# Patient Record
Sex: Male | Born: 1975
Health system: Southern US, Community
[De-identification: ages and names within clinical notes are randomized; demographics above are authoritative.]

---

## 2019-04-19 ENCOUNTER — Encounter (HOSPITAL_COMMUNITY): Payer: Self-pay | Admitting: Emergency Medicine

## 2019-04-19 ENCOUNTER — Inpatient Hospital Stay (HOSPITAL_COMMUNITY)
Admission: EM | Admit: 2019-04-19 | Discharge: 2019-04-23 | DRG: 957 | Disposition: A | Discharge status: Home / Self Care | Payer: PRIVATE HEALTH INSURANCE | Attending: General Surgery | Admitting: General Surgery

## 2019-04-19 ENCOUNTER — Other Ambulatory Visit: Payer: Self-pay

## 2019-04-19 ENCOUNTER — Emergency Department (HOSPITAL_COMMUNITY): Payer: PRIVATE HEALTH INSURANCE

## 2019-04-19 DIAGNOSIS — S27321A Contusion of lung, unilateral, initial encounter: Secondary | ICD-10-CM | POA: Diagnosis present

## 2019-04-19 DIAGNOSIS — S14107A Unspecified injury at C7 level of cervical spinal cord, initial encounter: Secondary | ICD-10-CM | POA: Diagnosis present

## 2019-04-19 DIAGNOSIS — E876 Hypokalemia: Secondary | ICD-10-CM | POA: Diagnosis not present

## 2019-04-19 DIAGNOSIS — S12600A Unspecified displaced fracture of seventh cervical vertebra, initial encounter for closed fracture: Secondary | ICD-10-CM | POA: Diagnosis present

## 2019-04-19 DIAGNOSIS — S0990XA Unspecified injury of head, initial encounter: Secondary | ICD-10-CM

## 2019-04-19 DIAGNOSIS — Z20828 Contact with and (suspected) exposure to other viral communicable diseases: Secondary | ICD-10-CM | POA: Diagnosis present

## 2019-04-19 DIAGNOSIS — Z23 Encounter for immunization: Secondary | ICD-10-CM

## 2019-04-19 DIAGNOSIS — S63284A Dislocation of proximal interphalangeal joint of right ring finger, initial encounter: Secondary | ICD-10-CM | POA: Diagnosis present

## 2019-04-19 DIAGNOSIS — J9811 Atelectasis: Secondary | ICD-10-CM

## 2019-04-19 DIAGNOSIS — S0240CA Maxillary fracture, right side, initial encounter for closed fracture: Secondary | ICD-10-CM | POA: Diagnosis present

## 2019-04-19 DIAGNOSIS — Z419 Encounter for procedure for purposes other than remedying health state, unspecified: Secondary | ICD-10-CM

## 2019-04-19 DIAGNOSIS — S63289A Dislocation of proximal interphalangeal joint of unspecified finger, initial encounter: Secondary | ICD-10-CM

## 2019-04-19 DIAGNOSIS — S022XXA Fracture of nasal bones, initial encounter for closed fracture: Secondary | ICD-10-CM | POA: Diagnosis present

## 2019-04-19 DIAGNOSIS — S12500A Unspecified displaced fracture of sixth cervical vertebra, initial encounter for closed fracture: Secondary | ICD-10-CM

## 2019-04-19 DIAGNOSIS — S02401A Maxillary fracture, unspecified, initial encounter for closed fracture: Secondary | ICD-10-CM | POA: Diagnosis present

## 2019-04-19 DIAGNOSIS — Z978 Presence of other specified devices: Secondary | ICD-10-CM

## 2019-04-19 DIAGNOSIS — S0181XA Laceration without foreign body of other part of head, initial encounter: Secondary | ICD-10-CM | POA: Diagnosis present

## 2019-04-19 DIAGNOSIS — S0101XA Laceration without foreign body of scalp, initial encounter: Secondary | ICD-10-CM | POA: Diagnosis present

## 2019-04-19 DIAGNOSIS — S22069A Unspecified fracture of T7-T8 vertebra, initial encounter for closed fracture: Secondary | ICD-10-CM | POA: Diagnosis present

## 2019-04-19 DIAGNOSIS — S22059A Unspecified fracture of T5-T6 vertebra, initial encounter for closed fracture: Secondary | ICD-10-CM | POA: Diagnosis present

## 2019-04-19 DIAGNOSIS — S0285XA Fracture of orbit, unspecified, initial encounter for closed fracture: Secondary | ICD-10-CM | POA: Diagnosis present

## 2019-04-19 DIAGNOSIS — S22000A Wedge compression fracture of unspecified thoracic vertebra, initial encounter for closed fracture: Secondary | ICD-10-CM

## 2019-04-19 DIAGNOSIS — M79641 Pain in right hand: Secondary | ICD-10-CM

## 2019-04-19 DIAGNOSIS — S22039A Unspecified fracture of third thoracic vertebra, initial encounter for closed fracture: Secondary | ICD-10-CM | POA: Diagnosis present

## 2019-04-19 DIAGNOSIS — S060X0A Concussion without loss of consciousness, initial encounter: Principal | ICD-10-CM | POA: Diagnosis present

## 2019-04-19 DIAGNOSIS — F17210 Nicotine dependence, cigarettes, uncomplicated: Secondary | ICD-10-CM | POA: Diagnosis present

## 2019-04-19 DIAGNOSIS — M79642 Pain in left hand: Secondary | ICD-10-CM

## 2019-04-19 DIAGNOSIS — S12601A Unspecified nondisplaced fracture of seventh cervical vertebra, initial encounter for closed fracture: Secondary | ICD-10-CM

## 2019-04-19 DIAGNOSIS — S42012A Anterior displaced fracture of sternal end of left clavicle, initial encounter for closed fracture: Secondary | ICD-10-CM | POA: Diagnosis present

## 2019-04-19 DIAGNOSIS — T148XXA Other injury of unspecified body region, initial encounter: Secondary | ICD-10-CM

## 2019-04-19 DIAGNOSIS — S42018A Nondisplaced fracture of sternal end of left clavicle, initial encounter for closed fracture: Secondary | ICD-10-CM

## 2019-04-19 DIAGNOSIS — S2242XA Multiple fractures of ribs, left side, initial encounter for closed fracture: Secondary | ICD-10-CM | POA: Diagnosis present

## 2019-04-19 DIAGNOSIS — Y92410 Unspecified street and highway as the place of occurrence of the external cause: Secondary | ICD-10-CM

## 2019-04-19 DIAGNOSIS — D62 Acute posthemorrhagic anemia: Secondary | ICD-10-CM | POA: Diagnosis not present

## 2019-04-19 DIAGNOSIS — J9601 Acute respiratory failure with hypoxia: Secondary | ICD-10-CM | POA: Diagnosis not present

## 2019-04-19 DIAGNOSIS — S0292XA Unspecified fracture of facial bones, initial encounter for closed fracture: Secondary | ICD-10-CM

## 2019-04-19 DIAGNOSIS — S0219XA Other fracture of base of skull, initial encounter for closed fracture: Secondary | ICD-10-CM | POA: Diagnosis present

## 2019-04-19 MED ORDER — TETANUS-DIPHTH-ACELL PERTUSSIS 5-2.5-18.5 LF-MCG/0.5 IM SUSP
0.5000 mL | Freq: Once | INTRAMUSCULAR | Status: AC
  Administered 2019-04-20: 02:00:00 0.5 mL via INTRAMUSCULAR
  Filled 2019-04-19: qty 0.5
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
## 2019-04-20 ENCOUNTER — Inpatient Hospital Stay (HOSPITAL_COMMUNITY): Payer: PRIVATE HEALTH INSURANCE | Admitting: Certified Registered"

## 2019-04-20 ENCOUNTER — Encounter (HOSPITAL_COMMUNITY): Payer: Self-pay | Admitting: Certified Registered Nurse Anesthetist

## 2019-04-20 ENCOUNTER — Inpatient Hospital Stay (HOSPITAL_COMMUNITY): Payer: PRIVATE HEALTH INSURANCE

## 2019-04-20 ENCOUNTER — Encounter (HOSPITAL_COMMUNITY): Admission: EM | Disposition: A | Discharge status: Home / Self Care | Payer: Self-pay | Source: Home / Self Care

## 2019-04-20 ENCOUNTER — Emergency Department (HOSPITAL_COMMUNITY): Payer: PRIVATE HEALTH INSURANCE

## 2019-04-20 DIAGNOSIS — S22039A Unspecified fracture of third thoracic vertebra, initial encounter for closed fracture: Secondary | ICD-10-CM | POA: Diagnosis present

## 2019-04-20 DIAGNOSIS — S2242XA Multiple fractures of ribs, left side, initial encounter for closed fracture: Secondary | ICD-10-CM | POA: Diagnosis present

## 2019-04-20 DIAGNOSIS — S0285XA Fracture of orbit, unspecified, initial encounter for closed fracture: Secondary | ICD-10-CM | POA: Diagnosis present

## 2019-04-20 DIAGNOSIS — S0240CA Maxillary fracture, right side, initial encounter for closed fracture: Secondary | ICD-10-CM | POA: Diagnosis present

## 2019-04-20 DIAGNOSIS — S22069A Unspecified fracture of T7-T8 vertebra, initial encounter for closed fracture: Secondary | ICD-10-CM | POA: Diagnosis present

## 2019-04-20 DIAGNOSIS — S12500A Unspecified displaced fracture of sixth cervical vertebra, initial encounter for closed fracture: Secondary | ICD-10-CM | POA: Diagnosis present

## 2019-04-20 DIAGNOSIS — Z20828 Contact with and (suspected) exposure to other viral communicable diseases: Secondary | ICD-10-CM | POA: Diagnosis present

## 2019-04-20 DIAGNOSIS — S42012A Anterior displaced fracture of sternal end of left clavicle, initial encounter for closed fracture: Secondary | ICD-10-CM | POA: Diagnosis present

## 2019-04-20 DIAGNOSIS — S022XXA Fracture of nasal bones, initial encounter for closed fracture: Secondary | ICD-10-CM | POA: Diagnosis present

## 2019-04-20 DIAGNOSIS — Y92410 Unspecified street and highway as the place of occurrence of the external cause: Secondary | ICD-10-CM | POA: Diagnosis not present

## 2019-04-20 DIAGNOSIS — S12601A Unspecified nondisplaced fracture of seventh cervical vertebra, initial encounter for closed fracture: Secondary | ICD-10-CM | POA: Diagnosis present

## 2019-04-20 DIAGNOSIS — S14107A Unspecified injury at C7 level of cervical spinal cord, initial encounter: Secondary | ICD-10-CM | POA: Diagnosis present

## 2019-04-20 DIAGNOSIS — S63284A Dislocation of proximal interphalangeal joint of right ring finger, initial encounter: Secondary | ICD-10-CM | POA: Diagnosis present

## 2019-04-20 DIAGNOSIS — S0181XA Laceration without foreign body of other part of head, initial encounter: Secondary | ICD-10-CM | POA: Diagnosis present

## 2019-04-20 DIAGNOSIS — S12600A Unspecified displaced fracture of seventh cervical vertebra, initial encounter for closed fracture: Secondary | ICD-10-CM | POA: Diagnosis present

## 2019-04-20 DIAGNOSIS — S02401A Maxillary fracture, unspecified, initial encounter for closed fracture: Secondary | ICD-10-CM | POA: Diagnosis present

## 2019-04-20 DIAGNOSIS — D62 Acute posthemorrhagic anemia: Secondary | ICD-10-CM | POA: Diagnosis not present

## 2019-04-20 DIAGNOSIS — S22059A Unspecified fracture of T5-T6 vertebra, initial encounter for closed fracture: Secondary | ICD-10-CM | POA: Diagnosis present

## 2019-04-20 DIAGNOSIS — S0101XA Laceration without foreign body of scalp, initial encounter: Secondary | ICD-10-CM | POA: Diagnosis present

## 2019-04-20 DIAGNOSIS — E876 Hypokalemia: Secondary | ICD-10-CM | POA: Diagnosis present

## 2019-04-20 DIAGNOSIS — J9601 Acute respiratory failure with hypoxia: Secondary | ICD-10-CM | POA: Diagnosis not present

## 2019-04-20 DIAGNOSIS — F17210 Nicotine dependence, cigarettes, uncomplicated: Secondary | ICD-10-CM | POA: Diagnosis present

## 2019-04-20 DIAGNOSIS — Z23 Encounter for immunization: Secondary | ICD-10-CM | POA: Diagnosis not present

## 2019-04-20 DIAGNOSIS — S27321A Contusion of lung, unilateral, initial encounter: Secondary | ICD-10-CM | POA: Diagnosis present

## 2019-04-20 DIAGNOSIS — S060X0A Concussion without loss of consciousness, initial encounter: Secondary | ICD-10-CM | POA: Diagnosis present

## 2019-04-20 HISTORY — PX: ANTERIOR CERVICAL DECOMP/DISCECTOMY FUSION: SHX1161

## 2019-04-20 LAB — CBC
HCT: 41.9 % (ref 39.0–52.0)
HCT: 54.3 % — ABNORMAL HIGH (ref 39.0–52.0)
Hemoglobin: 14 g/dL (ref 13.0–17.0)
Hemoglobin: 17.8 g/dL — ABNORMAL HIGH (ref 13.0–17.0)
MCH: 30.3 pg (ref 26.0–34.0)
MCH: 30.3 pg (ref 26.0–34.0)
MCHC: 32.8 g/dL (ref 30.0–36.0)
MCHC: 33.4 g/dL (ref 30.0–36.0)
MCV: 90.7 fL (ref 80.0–100.0)
MCV: 92.3 fL (ref 80.0–100.0)
Platelets: 215 10*3/uL (ref 150–400)
Platelets: 322 10*3/uL (ref 150–400)
RBC: 4.62 MIL/uL (ref 4.22–5.81)
RBC: 5.88 MIL/uL — ABNORMAL HIGH (ref 4.22–5.81)
RDW: 14.6 % (ref 11.5–15.5)
RDW: 15 % (ref 11.5–15.5)
WBC: 14.9 10*3/uL — ABNORMAL HIGH (ref 4.0–10.5)
WBC: 22.2 10*3/uL — ABNORMAL HIGH (ref 4.0–10.5)
nRBC: 0 % (ref 0.0–0.2)
nRBC: 0 % (ref 0.0–0.2)

## 2019-04-20 LAB — POCT I-STAT 7, (LYTES, BLD GAS, ICA,H+H)
Acid-base deficit: 4 mmol/L — ABNORMAL HIGH (ref 0.0–2.0)
Bicarbonate: 22.7 mmol/L (ref 20.0–28.0)
Calcium, Ion: 1.1 mmol/L — ABNORMAL LOW (ref 1.15–1.40)
HCT: 41 % (ref 39.0–52.0)
Hemoglobin: 13.9 g/dL (ref 13.0–17.0)
O2 Saturation: 100 %
Patient temperature: 98.6
Potassium: 3.6 mmol/L (ref 3.5–5.1)
Sodium: 140 mmol/L (ref 135–145)
TCO2: 24 mmol/L (ref 22–32)
pCO2 arterial: 45.2 mmHg (ref 32.0–48.0)
pH, Arterial: 7.308 — ABNORMAL LOW (ref 7.350–7.450)
pO2, Arterial: 358 mmHg — ABNORMAL HIGH (ref 83.0–108.0)

## 2019-04-20 LAB — COMPREHENSIVE METABOLIC PANEL
ALT: 39 U/L (ref 0–44)
AST: 85 U/L — ABNORMAL HIGH (ref 15–41)
Albumin: 3.9 g/dL (ref 3.5–5.0)
Alkaline Phosphatase: 65 U/L (ref 38–126)
Anion gap: 12 (ref 5–15)
BUN: 8 mg/dL (ref 6–20)
CO2: 23 mmol/L (ref 22–32)
Calcium: 8.6 mg/dL — ABNORMAL LOW (ref 8.9–10.3)
Chloride: 103 mmol/L (ref 98–111)
Creatinine, Ser: 1.07 mg/dL (ref 0.61–1.24)
GFR calc Af Amer: >60 mL/min (ref >60)
GFR calc non Af Amer: >60 mL/min (ref >60)
Glucose, Bld: 129 mg/dL — ABNORMAL HIGH (ref 70–99)
Potassium: 3.2 mmol/L — ABNORMAL LOW (ref 3.5–5.1)
Sodium: 138 mmol/L (ref 135–145)
Total Bilirubin: 0.6 mg/dL (ref 0.3–1.2)
Total Protein: 6.1 g/dL — ABNORMAL LOW (ref 6.5–8.1)

## 2019-04-20 LAB — SARS CORONAVIRUS 2 BY RT PCR (HOSPITAL ORDER, PERFORMED IN ~~LOC~~ HOSPITAL LAB): SARS Coronavirus 2: NEGATIVE

## 2019-04-20 LAB — I-STAT CHEM 8, ED
BUN: 9 mg/dL (ref 6–20)
Calcium, Ion: 0.95 mmol/L — ABNORMAL LOW (ref 1.15–1.40)
Chloride: 104 mmol/L (ref 98–111)
Creatinine, Ser: 0.9 mg/dL (ref 0.61–1.24)
Glucose, Bld: 120 mg/dL — ABNORMAL HIGH (ref 70–99)
HCT: 54 % — ABNORMAL HIGH (ref 39.0–52.0)
Hemoglobin: 18.4 g/dL — ABNORMAL HIGH (ref 13.0–17.0)
Potassium: 3.2 mmol/L — ABNORMAL LOW (ref 3.5–5.1)
Sodium: 138 mmol/L (ref 135–145)
TCO2: 22 mmol/L (ref 22–32)

## 2019-04-20 LAB — RAPID URINE DRUG SCREEN, HOSP PERFORMED
Amphetamines: NOT DETECTED
Barbiturates: NOT DETECTED
Benzodiazepines: NOT DETECTED
Cocaine: NOT DETECTED
Opiates: NOT DETECTED
Tetrahydrocannabinol: POSITIVE — AB

## 2019-04-20 LAB — PROTIME-INR
INR: 1.2 (ref 0.8–1.2)
Prothrombin Time: 15.2 seconds (ref 11.4–15.2)

## 2019-04-20 LAB — URINALYSIS, ROUTINE W REFLEX MICROSCOPIC
Bilirubin Urine: NEGATIVE
Glucose, UA: NEGATIVE mg/dL
Ketones, ur: NEGATIVE mg/dL
Leukocytes,Ua: NEGATIVE
Nitrite: NEGATIVE
Protein, ur: 30 mg/dL — AB
Specific Gravity, Urine: 1.01 (ref 1.005–1.030)
pH: 5 (ref 5.0–8.0)

## 2019-04-20 LAB — HIV ANTIBODY (ROUTINE TESTING W REFLEX): HIV Screen 4th Generation wRfx: NONREACTIVE

## 2019-04-20 LAB — SAMPLE TO BLOOD BANK

## 2019-04-20 LAB — LACTIC ACID, PLASMA
Lactic Acid, Venous: 3.1 mmol/L (ref 0.5–1.9)
Lactic Acid, Venous: 3.1 mmol/L (ref 0.5–1.9)

## 2019-04-20 LAB — CDS SEROLOGY

## 2019-04-20 LAB — ETHANOL: Alcohol, Ethyl (B): <10 mg/dL (ref <10)

## 2019-04-20 SURGERY — ANTERIOR CERVICAL DECOMPRESSION/DISCECTOMY FUSION 1 LEVEL
Anesthesia: General | Site: Spine Cervical

## 2019-04-20 MED ORDER — FENTANYL CITRATE (PF) 100 MCG/2ML IJ SOLN
INTRAMUSCULAR | Status: AC
  Filled 2019-04-20: qty 2

## 2019-04-20 MED ORDER — ACETAMINOPHEN 160 MG/5ML PO SOLN
325.0000 mg | Freq: Four times a day (QID) | ORAL | Status: DC
  Administered 2019-04-20: 08:00:00 325 mg
  Filled 2019-04-20: qty 20.3

## 2019-04-20 MED ORDER — DEXAMETHASONE SODIUM PHOSPHATE 10 MG/ML IJ SOLN
INTRAMUSCULAR | Status: AC
  Filled 2019-04-20: qty 1

## 2019-04-20 MED ORDER — 0.9 % SODIUM CHLORIDE (POUR BTL) OPTIME
TOPICAL | Status: DC | PRN
  Administered 2019-04-20: 16:00:00 1000 mL

## 2019-04-20 MED ORDER — ZIPRASIDONE MESYLATE 20 MG IM SOLR
10.0000 mg | Freq: Once | INTRAMUSCULAR | Status: DC
  Filled 2019-04-20: qty 20

## 2019-04-20 MED ORDER — STERILE WATER FOR INJECTION IJ SOLN
INTRAMUSCULAR | Status: AC
  Administered 2019-04-20: 02:00:00 2 mL
  Filled 2019-04-20: qty 10

## 2019-04-20 MED ORDER — LIDOCAINE-EPINEPHRINE 1 %-1:100000 IJ SOLN
INTRAMUSCULAR | Status: DC | PRN
  Administered 2019-04-20: 5 mL

## 2019-04-20 MED ORDER — MIDAZOLAM HCL 5 MG/5ML IJ SOLN
INTRAMUSCULAR | Status: DC | PRN
  Administered 2019-04-20: 2 mg via INTRAVENOUS

## 2019-04-20 MED ORDER — ONDANSETRON HCL 4 MG/2ML IJ SOLN
4.0000 mg | Freq: Four times a day (QID) | INTRAMUSCULAR | Status: DC | PRN
  Administered 2019-04-20 (×2): 4 mg via INTRAVENOUS
  Filled 2019-04-20: qty 2

## 2019-04-20 MED ORDER — LORAZEPAM 2 MG/ML IJ SOLN
2.0000 mg | Freq: Once | INTRAMUSCULAR | Status: AC
  Administered 2019-04-20: 02:00:00 2 mg via INTRAVENOUS

## 2019-04-20 MED ORDER — FENTANYL CITRATE (PF) 250 MCG/5ML IJ SOLN
INTRAMUSCULAR | Status: DC | PRN
  Administered 2019-04-20: 100 ug via INTRAVENOUS
  Administered 2019-04-20 (×6): 50 ug via INTRAVENOUS
  Administered 2019-04-20: 100 ug via INTRAVENOUS

## 2019-04-20 MED ORDER — ZIPRASIDONE MESYLATE 20 MG IM SOLR
10.0000 mg | Freq: Once | INTRAMUSCULAR | Status: AC
  Administered 2019-04-20: 10 mg via INTRAMUSCULAR
  Filled 2019-04-20: qty 20

## 2019-04-20 MED ORDER — PROPOFOL 10 MG/ML IV BOLUS
INTRAVENOUS | Status: AC
  Filled 2019-04-20: qty 20

## 2019-04-20 MED ORDER — LORAZEPAM 2 MG/ML IJ SOLN
INTRAMUSCULAR | Status: AC
  Filled 2019-04-20: qty 1

## 2019-04-20 MED ORDER — GADOBUTROL 1 MMOL/ML IV SOLN
7.0000 mL | Freq: Once | INTRAVENOUS | Status: AC | PRN
  Administered 2019-04-20: 14:00:00 7 mL via INTRAVENOUS

## 2019-04-20 MED ORDER — MIDAZOLAM HCL 2 MG/2ML IJ SOLN
INTRAMUSCULAR | Status: AC
  Filled 2019-04-20: qty 6

## 2019-04-20 MED ORDER — PROPOFOL 1000 MG/100ML IV EMUL
0.0000 ug/kg/min | INTRAVENOUS | Status: DC
  Administered 2019-04-20: 14:00:00 30 ug/kg/min via INTRAVENOUS
  Administered 2019-04-20: 08:00:00 40 ug/kg/min via INTRAVENOUS
  Administered 2019-04-20 – 2019-04-21 (×2): 35 ug/kg/min via INTRAVENOUS
  Filled 2019-04-20 (×4): qty 100

## 2019-04-20 MED ORDER — PNEUMOCOCCAL VAC POLYVALENT 25 MCG/0.5ML IJ INJ
0.5000 mL | INJECTION | INTRAMUSCULAR | Status: AC
  Administered 2019-04-23: 09:00:00 0.5 mL via INTRAMUSCULAR
  Filled 2019-04-20: qty 0.5

## 2019-04-20 MED ORDER — FENTANYL BOLUS VIA INFUSION
50.0000 ug | INTRAVENOUS | Status: DC | PRN
  Filled 2019-04-20: qty 50

## 2019-04-20 MED ORDER — SODIUM CHLORIDE 0.9 % IV BOLUS
1000.0000 mL | Freq: Once | INTRAVENOUS | Status: AC
  Administered 2019-04-20: 03:00:00 1000 mL via INTRAVENOUS

## 2019-04-20 MED ORDER — DEXAMETHASONE SODIUM PHOSPHATE 10 MG/ML IJ SOLN
INTRAMUSCULAR | Status: DC | PRN
  Administered 2019-04-20: 10 mg via INTRAVENOUS

## 2019-04-20 MED ORDER — LORAZEPAM 2 MG/ML IJ SOLN
1.0000 mg | Freq: Once | INTRAMUSCULAR | Status: AC
  Administered 2019-04-20: 1 mg via INTRAVENOUS

## 2019-04-20 MED ORDER — LACTATED RINGERS IV SOLN
INTRAVENOUS | Status: DC | PRN
  Administered 2019-04-20: 15:00:00 via INTRAVENOUS

## 2019-04-20 MED ORDER — THROMBIN 5000 UNITS EX SOLR
CUTANEOUS | Status: AC
  Filled 2019-04-20: qty 15000

## 2019-04-20 MED ORDER — HEMOSTATIC AGENTS (NO CHARGE) OPTIME
TOPICAL | Status: DC | PRN
  Administered 2019-04-20: 1

## 2019-04-20 MED ORDER — FENTANYL CITRATE (PF) 100 MCG/2ML IJ SOLN: 50.0000 ug | Freq: Once | INTRAMUSCULAR | Status: DC

## 2019-04-20 MED ORDER — QUETIAPINE FUMARATE 25 MG PO TABS
50.0000 mg | ORAL_TABLET | Freq: Two times a day (BID) | ORAL | Status: DC
  Administered 2019-04-20: 50 mg
  Filled 2019-04-20: qty 2

## 2019-04-20 MED ORDER — GABAPENTIN 300 MG/6ML PO SOLN
100.0000 mg | Freq: Three times a day (TID) | ORAL | Status: DC
  Administered 2019-04-20 – 2019-04-21 (×3): 100 mg
  Filled 2019-04-20 (×5): qty 2

## 2019-04-20 MED ORDER — LIDOCAINE-EPINEPHRINE 1 %-1:100000 IJ SOLN
INTRAMUSCULAR | Status: AC
  Filled 2019-04-20: qty 1

## 2019-04-20 MED ORDER — CEFAZOLIN SODIUM 1 G IJ SOLR
INTRAMUSCULAR | Status: AC
  Filled 2019-04-20: qty 20

## 2019-04-20 MED ORDER — SODIUM CHLORIDE 0.9 % IV SOLN
INTRAVENOUS | Status: DC | PRN
  Administered 2019-04-20: 16:00:00 500 mL

## 2019-04-20 MED ORDER — PROPOFOL 1000 MG/100ML IV EMUL
INTRAVENOUS | Status: AC | PRN
  Administered 2019-04-20: 50 ug via INTRAVENOUS

## 2019-04-20 MED ORDER — ORAL CARE MOUTH RINSE
15.0000 mL | OROMUCOSAL | Status: DC
  Administered 2019-04-20 – 2019-04-21 (×13): 15 mL via OROMUCOSAL

## 2019-04-20 MED ORDER — PROPOFOL 10 MG/ML IV BOLUS
INTRAVENOUS | Status: DC | PRN
  Administered 2019-04-20: 100 mg via INTRAVENOUS

## 2019-04-20 MED ORDER — ACETAMINOPHEN 160 MG/5ML PO SOLN
325.0000 mg | Freq: Four times a day (QID) | ORAL | Status: DC
  Administered 2019-04-20 – 2019-04-21 (×5): 325 mg
  Filled 2019-04-20 (×6): qty 20.3

## 2019-04-20 MED ORDER — THROMBIN 5000 UNITS EX SOLR
OROMUCOSAL | Status: DC | PRN
  Administered 2019-04-20: 16:00:00 5 mL

## 2019-04-20 MED ORDER — ROCURONIUM BROMIDE 10 MG/ML (PF) SYRINGE
PREFILLED_SYRINGE | INTRAVENOUS | Status: DC | PRN
  Administered 2019-04-20: 60 mg via INTRAVENOUS
  Administered 2019-04-20: 40 mg via INTRAVENOUS

## 2019-04-20 MED ORDER — PROPOFOL 1000 MG/100ML IV EMUL
INTRAVENOUS | Status: AC
  Filled 2019-04-20: qty 100

## 2019-04-20 MED ORDER — MIDAZOLAM HCL 5 MG/5ML IJ SOLN
INTRAMUSCULAR | Status: AC | PRN
  Administered 2019-04-20: 5 mg via INTRAVENOUS

## 2019-04-20 MED ORDER — FENTANYL 2500MCG IN NS 250ML (10MCG/ML) PREMIX INFUSION
50.0000 ug/h | INTRAVENOUS | Status: DC
  Administered 2019-04-20: 50 ug/h via INTRAVENOUS
  Filled 2019-04-20: qty 250

## 2019-04-20 MED ORDER — BUPIVACAINE HCL 0.5 % IJ SOLN
INTRAMUSCULAR | Status: DC | PRN
  Administered 2019-04-20: 5 mL

## 2019-04-20 MED ORDER — ONDANSETRON HCL 4 MG/2ML IJ SOLN
INTRAMUSCULAR | Status: AC
  Filled 2019-04-20: qty 2

## 2019-04-20 MED ORDER — HYDRALAZINE HCL 20 MG/ML IJ SOLN: 10.0000 mg | INTRAMUSCULAR | Status: DC | PRN

## 2019-04-20 MED ORDER — ETOMIDATE 2 MG/ML IV SOLN
INTRAVENOUS | Status: AC | PRN
  Administered 2019-04-20: 20 mg via INTRAVENOUS

## 2019-04-20 MED ORDER — ONDANSETRON 4 MG PO TBDP: 4.0000 mg | ORAL_TABLET | Freq: Four times a day (QID) | ORAL | Status: DC | PRN

## 2019-04-20 MED ORDER — PANTOPRAZOLE SODIUM 40 MG IV SOLR
40.0000 mg | INTRAVENOUS | Status: DC
  Administered 2019-04-20 – 2019-04-23 (×4): 40 mg via INTRAVENOUS
  Filled 2019-04-20 (×4): qty 40

## 2019-04-20 MED ORDER — ALBUMIN HUMAN 5 % IV SOLN
12.5000 g | Freq: Once | INTRAVENOUS | Status: AC
  Administered 2019-04-20: 09:00:00 12.5 g via INTRAVENOUS
  Filled 2019-04-20: qty 250

## 2019-04-20 MED ORDER — MIDAZOLAM HCL 2 MG/2ML IJ SOLN
INTRAMUSCULAR | Status: AC
  Filled 2019-04-20: qty 2

## 2019-04-20 MED ORDER — IOHEXOL 300 MG/ML  SOLN
100.0000 mL | Freq: Once | INTRAMUSCULAR | Status: AC | PRN
  Administered 2019-04-20: 05:00:00 100 mL via INTRAVENOUS

## 2019-04-20 MED ORDER — BISACODYL 10 MG RE SUPP: 10.0000 mg | Freq: Every day | RECTAL | Status: DC | PRN

## 2019-04-20 MED ORDER — CHLORHEXIDINE GLUCONATE 0.12% ORAL RINSE (MEDLINE KIT)
15.0000 mL | Freq: Two times a day (BID) | OROMUCOSAL | Status: DC
  Administered 2019-04-20 – 2019-04-23 (×7): 15 mL via OROMUCOSAL

## 2019-04-20 MED ORDER — DOCUSATE SODIUM 50 MG/5ML PO LIQD: 200.0000 mg | Freq: Two times a day (BID) | ORAL | Status: DC | PRN

## 2019-04-20 MED ORDER — SUCCINYLCHOLINE CHLORIDE 20 MG/ML IJ SOLN
INTRAMUSCULAR | Status: AC | PRN
  Administered 2019-04-20: 100 mg via INTRAVENOUS

## 2019-04-20 MED ORDER — FENTANYL CITRATE (PF) 100 MCG/2ML IJ SOLN
INTRAMUSCULAR | Status: AC | PRN
  Administered 2019-04-20: 100 ug via INTRAVENOUS

## 2019-04-20 MED ORDER — PROPOFOL 1000 MG/100ML IV EMUL
INTRAVENOUS | Status: AC
  Administered 2019-04-20: 08:00:00 40 ug/kg/min via INTRAVENOUS
  Filled 2019-04-20: qty 100

## 2019-04-20 MED ORDER — FENTANYL CITRATE (PF) 250 MCG/5ML IJ SOLN
INTRAMUSCULAR | Status: AC
  Filled 2019-04-20: qty 5

## 2019-04-20 MED ORDER — STERILE WATER FOR INJECTION IJ SOLN
INTRAMUSCULAR | Status: AC
  Administered 2019-04-20: 02:00:00
  Filled 2019-04-20: qty 10

## 2019-04-20 MED ORDER — BUPIVACAINE HCL (PF) 0.5 % IJ SOLN
INTRAMUSCULAR | Status: AC
  Filled 2019-04-20: qty 30

## 2019-04-20 MED ORDER — CEFAZOLIN SODIUM-DEXTROSE 2-3 GM-%(50ML) IV SOLR
INTRAVENOUS | Status: DC | PRN
  Administered 2019-04-20: 2 g via INTRAVENOUS

## 2019-04-20 MED ORDER — ZIPRASIDONE MESYLATE 20 MG IM SOLR
10.0000 mg | Freq: Once | INTRAMUSCULAR | Status: AC
  Administered 2019-04-20: 02:00:00 10 mg via INTRAMUSCULAR

## 2019-04-20 MED ORDER — THROMBIN 5000 UNITS EX SOLR
CUTANEOUS | Status: DC | PRN
  Administered 2019-04-20 (×2): 5000 [IU] via TOPICAL

## 2019-04-20 MED ORDER — LACTATED RINGERS IV SOLN
INTRAVENOUS | Status: DC
  Administered 2019-04-20 – 2019-04-22 (×4): via INTRAVENOUS

## 2019-04-20 SURGICAL SUPPLY — 65 items
BAG DECANTER FOR FLEXI CONT (MISCELLANEOUS) ×3 IMPLANT
BASKET BONE COLLECTION (BASKET) ×2 IMPLANT
BENZOIN TINCTURE PRP APPL 2/3 (GAUZE/BANDAGES/DRESSINGS) ×2 IMPLANT
BLADE CLIPPER SURG (BLADE) IMPLANT
BLADE SURG 11 STRL SS (BLADE) ×3 IMPLANT
BLADE ULTRA TIP 2M (BLADE) IMPLANT
BUR MATCHSTICK NEURO 3.0 LAGG (BURR) ×3 IMPLANT
CANISTER SUCT 3000ML PPV (MISCELLANEOUS) ×3 IMPLANT
CARTRIDGE OIL MAESTRO DRILL (MISCELLANEOUS) ×1 IMPLANT
CLOSURE WOUND 1/2 X4 (GAUZE/BANDAGES/DRESSINGS) ×1
COVER WAND RF STERILE (DRAPES) ×1 IMPLANT
DECANTER SPIKE VIAL GLASS SM (MISCELLANEOUS) ×3 IMPLANT
DERMABOND ADVANCED (GAUZE/BANDAGES/DRESSINGS) ×2
DERMABOND ADVANCED .7 DNX12 (GAUZE/BANDAGES/DRESSINGS) ×1 IMPLANT
DEVICE ENDSKLTN TC NANOLCK 6MM (Cage) IMPLANT
DIFFUSER DRILL AIR PNEUMATIC (MISCELLANEOUS) ×3 IMPLANT
DRAPE C-ARM 42X72 X-RAY (DRAPES) ×6 IMPLANT
DRAPE HALF SHEET 40X57 (DRAPES) IMPLANT
DRAPE LAPAROTOMY 100X72 PEDS (DRAPES) ×3 IMPLANT
DRAPE MICROSCOPE LEICA (MISCELLANEOUS) ×3 IMPLANT
DRSG OPSITE 4X5.5 SM (GAUZE/BANDAGES/DRESSINGS) ×6 IMPLANT
DRSG OPSITE POSTOP 3X4 (GAUZE/BANDAGES/DRESSINGS) ×2 IMPLANT
DURAPREP 6ML APPLICATOR 50/CS (WOUND CARE) ×3 IMPLANT
ELECT COATED BLADE 2.86 ST (ELECTRODE) ×3 IMPLANT
ELECT REM PT RETURN 9FT ADLT (ELECTROSURGICAL) ×3
ELECTRODE REM PT RTRN 9FT ADLT (ELECTROSURGICAL) ×1 IMPLANT
ENDOSKELETON TC NANOLOCK 6MM (Cage) ×3 IMPLANT
GAUZE 4X4 16PLY RFD (DISPOSABLE) IMPLANT
GLOVE BIO SURGEON STRL SZ7.5 (GLOVE) ×2 IMPLANT
GLOVE BIOGEL PI IND STRL 6 (GLOVE) IMPLANT
GLOVE BIOGEL PI IND STRL 7.5 (GLOVE) ×2 IMPLANT
GLOVE BIOGEL PI INDICATOR 6 (GLOVE) ×4
GLOVE BIOGEL PI INDICATOR 7.5 (GLOVE) ×6
GLOVE ECLIPSE 7.0 STRL STRAW (GLOVE) ×3 IMPLANT
GLOVE SS N UNI LF 6.5 STRL (GLOVE) ×8 IMPLANT
GOWN STRL REUS W/ TWL LRG LVL3 (GOWN DISPOSABLE) ×2 IMPLANT
GOWN STRL REUS W/ TWL XL LVL3 (GOWN DISPOSABLE) IMPLANT
GOWN STRL REUS W/TWL 2XL LVL3 (GOWN DISPOSABLE) IMPLANT
GOWN STRL REUS W/TWL LRG LVL3 (GOWN DISPOSABLE) ×6
GOWN STRL REUS W/TWL XL LVL3 (GOWN DISPOSABLE)
HEMOSTAT POWDER KIT SURGIFOAM (HEMOSTASIS) ×3 IMPLANT
KIT BASIN OR (CUSTOM PROCEDURE TRAY) ×3 IMPLANT
KIT TURNOVER KIT B (KITS) ×3 IMPLANT
NDL SPNL 22GX3.5 QUINCKE BK (NEEDLE) ×1 IMPLANT
NEEDLE HYPO 22GX1.5 SAFETY (NEEDLE) ×3 IMPLANT
NEEDLE SPNL 22GX3.5 QUINCKE BK (NEEDLE) ×3 IMPLANT
NS IRRIG 1000ML POUR BTL (IV SOLUTION) ×3 IMPLANT
OIL CARTRIDGE MAESTRO DRILL (MISCELLANEOUS) ×3
PACK LAMINECTOMY NEURO (CUSTOM PROCEDURE TRAY) ×3 IMPLANT
PAD ARMBOARD 7.5X6 YLW CONV (MISCELLANEOUS) ×9 IMPLANT
PLATE ZEVO 1LVL 19MM (Plate) ×2 IMPLANT
PUTTY DBF 1CC CORTICAL FIBERS (Putty) ×2 IMPLANT
RUBBERBAND STERILE (MISCELLANEOUS) ×6 IMPLANT
SCREW 3.5 SELFDRILL 15MM VARI (Screw) ×8 IMPLANT
SPONGE INTESTINAL PEANUT (DISPOSABLE) ×3 IMPLANT
SPONGE SURGIFOAM ABS GEL SZ50 (HEMOSTASIS) ×3 IMPLANT
STAPLER VISISTAT 35W (STAPLE) ×3 IMPLANT
STRIP CLOSURE SKIN 1/2X4 (GAUZE/BANDAGES/DRESSINGS) ×1 IMPLANT
SUT VIC AB 2-0 CP2 18 (SUTURE) ×2 IMPLANT
SUT VIC AB 3-0 SH 8-18 (SUTURE) ×3 IMPLANT
SUT VICRYL 3-0 RB1 18 ABS (SUTURE) ×6 IMPLANT
TAPE CLOTH 3X10 TAN LF (GAUZE/BANDAGES/DRESSINGS) ×3 IMPLANT
TOWEL GREEN STERILE (TOWEL DISPOSABLE) ×3 IMPLANT
TOWEL GREEN STERILE FF (TOWEL DISPOSABLE) ×3 IMPLANT
WATER STERILE IRR 1000ML POUR (IV SOLUTION) ×3 IMPLANT
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
## 2019-04-20 NOTE — Consult Note (Signed)
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 Left-sided C6 lamina and articular process fracture. 2. Small volume dorsal epidural hemorrhage at the level of fracturing. Electronically Signed   By: Monte Fantasia M.D.   On: 04/20/2019 05:30    NAT:[id number] except as listed in admit H&P  Blood pressure 112/76, pulse 92, temperature (!) 101.7 F (38.7 C), resp. rate 16, height 6' (1.829 m), weight 56.7 kg, SpO2 100 %.  PHYSICAL EXAM: Overall appearance: Sedated on ventilator Head:  Normocephalic.  zygomatic arches are solid and immobile. Ears: External ears look healthy. Nose: External nose is mobile but relatively symmetric, right alar laceration is closed  nicely.   Oral Cavity/Pharynx:  There are no mucosal lesions or masses identified.  Mandible intact.  Maxilla is stable. Larynx/Hypopharynx: Deferred Neuro: Heavily sedated on ventilator. Neck: No palpable neck masses.  C-collar in place.  Studies Reviewed: Maxillofacial CT  Procedures: none   Assessment/Plan: Multiple facial fractures  including bilateral nasal bone fractures, clinically not displaced as the nose appears to be symmetric.  Also nondisplaced orbital and maxillary fractures with some displacement of the face of the maxilla.  None of these should require surgical intervention.  Follow-up as needed.  Serena Colonel 04/20/2019, 9:23 AM

## 2019-04-20 NOTE — ED Notes (Addendum)
CT staff called this RN to report pt being combative and very aggressive. EDP notified and orders placed for ativan. Pt continued to be combative after medication given. EDP notified and pt returned to room without completion of CT scans. This RN suggested to EDP that patient should be intubated in order to complete scans safely. Pt returned to room, pt continued to be combative and trying to get out of bed. EDP notified of continued aggression and orders placed for Geodon. Pt continuing to attempt to get out of bed after medication given.
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
## 2019-04-20 NOTE — Progress Notes (Signed)
Belongings with patient:  1 pair of pants 1 brown belt 1 pair of socks 1 pair of shoes  Locked up with security:  1 black wallet 1 pocket knife 1 belt buckle knife  Cash in wallet and items secured in security verified by RN Monico Hoar.   Went over belongings that were brought up with patient with pt's sister Eustaquio Maize and pt's mother.
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
## 2019-04-20 NOTE — Anesthesia Preprocedure Evaluation (Addendum)
Anesthesia Evaluation  Patient identified by MRN, date of birth, ID band Patient unresponsive  General Assessment Comment:History noted. CG  Reviewed: Allergy & Precautions, NPO status , Patient's Chart, lab work & pertinent test results  Airway Mallampati: II       Dental   Pulmonary Current Smoker,    breath sounds clear to auscultation       Cardiovascular negative cardio ROS   Rhythm:Regular Rate:Normal     Neuro/Psych History noted. CG    GI/Hepatic negative GI ROS, Neg liver ROS,   Endo/Other  negative endocrine ROS  Renal/GU negative Renal ROS     Musculoskeletal   Abdominal   Peds  Hematology   Anesthesia Other Findings   Reproductive/Obstetrics                            Anesthesia Physical Anesthesia Plan  ASA: III  Anesthesia Plan: General   Post-op Pain Management:    Induction: Intravenous  PONV Risk Score and Plan: 2 and Ondansetron, Dexamethasone and Treatment may vary due to age or medical condition  Airway Management Planned: Oral ETT  Additional Equipment:   Intra-op Plan:   Post-operative Plan: Extubation in OR  Informed Consent: I have reviewed the patients History and Physical, chart, labs and discussed the procedure including the risks, benefits and alternatives for the proposed anesthesia with the patient or authorized representative who has indicated his/her understanding and acceptance.       Plan Discussed with: CRNA, Anesthesiologist and Surgeon  Anesthesia Plan Comments:        Anesthesia Quick Evaluation

## 2019-04-20 NOTE — Progress Notes (Signed)
Orthopedic Tech Progress Note Patient Details:  Louis Hodge Dec 20, 1975 [id number] Unable to put on shoulder immobilizer because of IV site and patient being in restraints.  Patient ID: Louis Hodge, male   DOB: 1976-06-11, 43 y.o.   MRN: [id number]   Melony Overly T 04/20/2019, 6:52 PM

## 2019-04-20 NOTE — H&P (Signed)
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 are normal.  Neck: Neck supple. No tracheal deviation present.  Cardiovascular: Normal rate and regular rhythm.  Respiratory: Breath sounds normal. No respiratory distress. He has no wheezes.  GI: Soft. He exhibits no distension.  Genitourinary:    Penis normal.   Musculoskeletal:        General: No deformity or edema.  Neurological:  Currently GCS 6T, sedated  Skin: Skin is warm and dry.  Psychiatric:  Unable to assess 2/2 intubation/sedation   INJURIES IDENTIFIED: 1. Left scalp laceration, closed with staples in ED 2. Bilateral nasal arch fractures extending to lacrimal canal 3. Right maxillary sinus fx into inf and lat orbital rims 4. Comminuted right posterior element fxs at C6 & C7 5. Left C6 lamina and articular process fx 6. Small volume dorsal epidural hemorrhage 7. L rib fxs - 1-3, 8 8. Sup endplate fxs T3, T5, T6, T7 9. Comminuted medial left clavicle fx 10. LUL ctx and collapse  PLAN: -Admit to trauma ICU -NPO, MIVF, ventilator support -Neurosurgery - Nundkumar -ENT - Pollyann Kennedyosen -Ortho -  Lisbeth PlyMurphy  Gorgeous [place] M. Cliffton AstersWhite, M.D. [facility] 04/20/2019, 6:18 AM

## 2019-04-20 NOTE — Progress Notes (Signed)
CT and back to ED RM with RN without any complications

## 2019-04-20 NOTE — Plan of Care (Signed)
  Problem: Elimination: Goal: Will not experience complications related to urinary retention Outcome: Progressing   Problem: Pain Managment: Goal: General experience of comfort will improve Outcome: Progressing   Problem: Safety: Goal: Ability to remain free from injury will improve Outcome: Progressing   Problem: Activity: Goal: Risk for activity intolerance will decrease Outcome: Not Progressing

## 2019-04-20 NOTE — Brief Op Note (Signed)
  NEUROSURGERY BRIEF OPERATIVE NOTE   PREOP DX: Cervical fracture, C6  POSTOP DX: Same  PROCEDURE: ACDF C6-7  SURGEON: Dr. Consuella Lose, MD  ASSISTANT: Ferne Reus, PA-C  ANESTHESIA: GETA  EBL: 100cc  SPECIMENS: None  DRAINS: None  COMPLICATIONS: None immediate  CONDITION: Hemodynamically stable to PACU

## 2019-04-20 NOTE — Progress Notes (Signed)
Assisted tele visit to patient with mother.  Ansley Mangiapane P, RN  

## 2019-04-20 NOTE — Anesthesia Postprocedure Evaluation (Signed)
Anesthesia Post Note  Patient: Louis Hodge  Procedure(s) Performed: ANTERIOR CERVICAL DECOMPRESSION/DISCECTOMY FUSION CERIVCAL SIX-SEVEN  ONE LEVEL (N/A Spine Cervical)     Patient location during evaluation: ICU Anesthesia Type: General Level of consciousness: patient remains intubated per anesthesia plan Pain management: pain level controlled Vital Signs Assessment: post-procedure vital signs reviewed and stable Respiratory status: patient remains intubated per anesthesia plan Cardiovascular status: stable Postop Assessment: no apparent nausea or vomiting Anesthetic complications: no    Last Vitals:  Vitals:   04/20/19 1400 04/20/19 1500  BP: 93/61 111/63  Pulse: 76 69  Resp: 16 16  Temp: 37.8 C 37.7 C  SpO2: 100% 100%    Last Pain:  Vitals:   04/20/19 0800  TempSrc: Core (Comment)  PainSc:                  Shauntelle Jamerson

## 2019-04-20 NOTE — ED Notes (Signed)
Pt actively trying to get out of bed. Pt very confused and not following any orders. This Nurse Tech is in room to watch over pt.

## 2019-04-20 NOTE — ED Notes (Signed)
Pt refusing to maintain C-collar. This RN and Nurse Tech have applied C-collar 4 times with continued removal by pt. This RN educated pt on the risk and reason for precautions. Pt still refusing C-collar.

## 2019-04-20 NOTE — Transfer of Care (Signed)
Immediate Anesthesia Transfer of Care Note  Patient: Louis Hodge  Procedure(s) Performed: ANTERIOR CERVICAL DECOMPRESSION/DISCECTOMY FUSION CERIVCAL SIX-SEVEN  ONE LEVEL (N/A Spine Cervical)  Patient Location: PACU  Anesthesia Type:General  Level of Consciousness: Patient remains intubated per anesthesia plan  Airway & Oxygen Therapy: Patient remains intubated per anesthesia plan and Patient placed on Ventilator (see vital sign flow sheet for setting)  Post-op Assessment: Report given to RN and Post -op Vital signs reviewed and stable  Post vital signs: Reviewed and stable  Last Vitals:  Vitals Value Taken Time  BP 101/50 04/20/19 1710  Temp 37.3 C 04/20/19 1715  Pulse 77 04/20/19 1715  Resp 16 04/20/19 1715  SpO2 100 % 04/20/19 1715  Vitals shown include unvalidated device data.  Last Pain:  Vitals:   04/20/19 0800  TempSrc: Core (Comment)  PainSc:          Complications: No apparent anesthesia complications

## 2019-04-21 ENCOUNTER — Inpatient Hospital Stay (HOSPITAL_COMMUNITY): Payer: PRIVATE HEALTH INSURANCE

## 2019-04-21 ENCOUNTER — Encounter (HOSPITAL_COMMUNITY): Payer: Self-pay | Admitting: Neurosurgery

## 2019-04-21 LAB — CBC
HCT: 41.3 % (ref 39.0–52.0)
HCT: 39.1 % (ref 39.0–52.0)
Hemoglobin: 13.6 g/dL (ref 13.0–17.0)
Hemoglobin: 13.1 g/dL (ref 13.0–17.0)
MCH: 30 pg (ref 26.0–34.0)
MCH: 30.3 pg (ref 26.0–34.0)
MCHC: 32.9 g/dL (ref 30.0–36.0)
MCHC: 33.5 g/dL (ref 30.0–36.0)
MCV: 91.2 fL (ref 80.0–100.0)
MCV: 90.3 fL (ref 80.0–100.0)
Platelets: 223 10*3/uL (ref 150–400)
Platelets: 224 10*3/uL (ref 150–400)
RBC: 4.53 MIL/uL (ref 4.22–5.81)
RBC: 4.33 MIL/uL (ref 4.22–5.81)
RDW: 15 % (ref 11.5–15.5)
RDW: 15.1 % (ref 11.5–15.5)
WBC: 14.9 10*3/uL — ABNORMAL HIGH (ref 4.0–10.5)
WBC: 21.4 10*3/uL — ABNORMAL HIGH (ref 4.0–10.5)
nRBC: 0 % (ref 0.0–0.2)
nRBC: 0 % (ref 0.0–0.2)

## 2019-04-21 LAB — TRIGLYCERIDES: Triglycerides: 38 mg/dL (ref <150)

## 2019-04-21 LAB — PROTIME-INR
INR: 1.2 (ref 0.8–1.2)
Prothrombin Time: 15 seconds (ref 11.4–15.2)

## 2019-04-21 LAB — BASIC METABOLIC PANEL
Anion gap: 8 (ref 5–15)
Anion gap: 8 (ref 5–15)
BUN: 8 mg/dL (ref 6–20)
BUN: 8 mg/dL (ref 6–20)
CO2: 22 mmol/L (ref 22–32)
CO2: 24 mmol/L (ref 22–32)
Calcium: 8.3 mg/dL — ABNORMAL LOW (ref 8.9–10.3)
Calcium: 8.4 mg/dL — ABNORMAL LOW (ref 8.9–10.3)
Chloride: 109 mmol/L (ref 98–111)
Chloride: 108 mmol/L (ref 98–111)
Creatinine, Ser: 0.98 mg/dL (ref 0.61–1.24)
Creatinine, Ser: 0.8 mg/dL (ref 0.61–1.24)
GFR calc Af Amer: >60 mL/min (ref >60)
GFR calc Af Amer: >60 mL/min (ref >60)
GFR calc non Af Amer: >60 mL/min (ref >60)
GFR calc non Af Amer: >60 mL/min (ref >60)
Glucose, Bld: 110 mg/dL — ABNORMAL HIGH (ref 70–99)
Glucose, Bld: 105 mg/dL — ABNORMAL HIGH (ref 70–99)
Potassium: 4 mmol/L (ref 3.5–5.1)
Potassium: 4 mmol/L (ref 3.5–5.1)
Sodium: 139 mmol/L (ref 135–145)
Sodium: 140 mmol/L (ref 135–145)

## 2019-04-21 LAB — APTT: aPTT: 30 seconds (ref 24–36)

## 2019-04-21 MED ORDER — CEFAZOLIN SODIUM-DEXTROSE 2-4 GM/100ML-% IV SOLN: 2.0000 g | Freq: Three times a day (TID) | INTRAVENOUS | Status: DC

## 2019-04-21 MED ORDER — ADULT MULTIVITAMIN W/MINERALS CH
1.0000 | ORAL_TABLET | Freq: Every day | ORAL | Status: DC
  Administered 2019-04-21 – 2019-04-23 (×3): 1 via ORAL
  Filled 2019-04-21 (×3): qty 1

## 2019-04-21 MED ORDER — MENTHOL 3 MG MT LOZG: 1.0000 | LOZENGE | OROMUCOSAL | Status: DC | PRN

## 2019-04-21 MED ORDER — LORAZEPAM 1 MG PO TABS: 0.0000 mg | ORAL_TABLET | Freq: Four times a day (QID) | ORAL | Status: DC

## 2019-04-21 MED ORDER — PHENOL 1.4 % MT LIQD: 1.0000 | OROMUCOSAL | Status: DC | PRN

## 2019-04-21 MED ORDER — OXYCODONE HCL 5 MG PO TABS
5.0000 mg | ORAL_TABLET | ORAL | Status: DC | PRN
  Administered 2019-04-21: 5 mg via ORAL
  Administered 2019-04-21 – 2019-04-23 (×3): 10 mg via ORAL
  Filled 2019-04-21 (×3): qty 2
  Filled 2019-04-21: qty 1

## 2019-04-21 MED ORDER — CEFAZOLIN SODIUM-DEXTROSE 2-4 GM/100ML-% IV SOLN
2.0000 g | Freq: Three times a day (TID) | INTRAVENOUS | Status: AC
  Administered 2019-04-21 (×2): 2 g via INTRAVENOUS
  Filled 2019-04-21 (×2): qty 100

## 2019-04-21 MED ORDER — VITAMIN B-1 100 MG PO TABS
100.0000 mg | ORAL_TABLET | Freq: Every day | ORAL | Status: DC
  Administered 2019-04-21 – 2019-04-23 (×3): 100 mg via ORAL
  Filled 2019-04-21 (×3): qty 1

## 2019-04-21 MED ORDER — NICOTINE 7 MG/24HR TD PT24
7.0000 mg | MEDICATED_PATCH | Freq: Every day | TRANSDERMAL | Status: DC
  Administered 2019-04-21 – 2019-04-23 (×3): 7 mg via TRANSDERMAL
  Filled 2019-04-21 (×3): qty 1

## 2019-04-21 MED ORDER — CHLORHEXIDINE GLUCONATE 0.12 % MT SOLN
OROMUCOSAL | Status: AC
  Filled 2019-04-21: qty 15

## 2019-04-21 MED ORDER — FOLIC ACID 1 MG PO TABS
1.0000 mg | ORAL_TABLET | Freq: Every day | ORAL | Status: DC
  Administered 2019-04-21 – 2019-04-23 (×3): 1 mg via ORAL
  Filled 2019-04-21 (×3): qty 1

## 2019-04-21 MED ORDER — SODIUM CHLORIDE 0.9% FLUSH: 3.0000 mL | INTRAVENOUS | Status: DC | PRN

## 2019-04-21 MED ORDER — GABAPENTIN 300 MG PO CAPS
300.0000 mg | ORAL_CAPSULE | Freq: Three times a day (TID) | ORAL | Status: DC
  Administered 2019-04-21 – 2019-04-23 (×6): 300 mg via ORAL
  Filled 2019-04-21 (×6): qty 1

## 2019-04-21 MED ORDER — LORAZEPAM 1 MG PO TABS: 0.0000 mg | ORAL_TABLET | Freq: Two times a day (BID) | ORAL | Status: DC

## 2019-04-21 MED ORDER — SODIUM CHLORIDE 0.9% FLUSH
3.0000 mL | Freq: Two times a day (BID) | INTRAVENOUS | Status: DC
  Administered 2019-04-21: 3 mL via INTRAVENOUS

## 2019-04-21 MED ORDER — SODIUM CHLORIDE 0.9 % IV SOLN: INTRAVENOUS | Status: DC

## 2019-04-21 MED ORDER — LORAZEPAM 2 MG/ML IJ SOLN: 1.0000 mg | Freq: Four times a day (QID) | INTRAMUSCULAR | Status: DC | PRN

## 2019-04-21 MED ORDER — THIAMINE HCL 100 MG/ML IJ SOLN: 100.0000 mg | Freq: Every day | INTRAMUSCULAR | Status: DC

## 2019-04-21 MED ORDER — GABAPENTIN 600 MG PO TABS
300.0000 mg | ORAL_TABLET | Freq: Three times a day (TID) | ORAL | Status: DC
  Administered 2019-04-21 (×2): 300 mg via ORAL
  Filled 2019-04-21: qty 1
  Filled 2019-04-21: qty 0.5
  Filled 2019-04-21: qty 1

## 2019-04-21 MED ORDER — SODIUM CHLORIDE 0.9 % IV SOLN: 250.0000 mL | INTRAVENOUS | Status: DC

## 2019-04-21 MED ORDER — HYDROMORPHONE HCL 1 MG/ML IJ SOLN: 1.0000 mg | INTRAMUSCULAR | Status: DC | PRN

## 2019-04-21 MED ORDER — LORAZEPAM 1 MG PO TABS
1.0000 mg | ORAL_TABLET | Freq: Four times a day (QID) | ORAL | Status: DC | PRN
  Administered 2019-04-21: 1 mg via ORAL
  Filled 2019-04-21: qty 1

## 2019-04-21 NOTE — Progress Notes (Signed)
Orthopedic Tech Progress Note Patient Details:  Louis Hodge [birth year] [id number]  Ortho Devices Type of Ortho Device: Shoulder immobilizer Ortho Device/Splint Location: left Ortho Device/Splint Interventions: Application   Post Interventions Patient Tolerated: Well Instructions Provided: Care of device   [place] Pink 04/21/2019, 10:52 AM
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
## 2019-04-21 NOTE — Progress Notes (Signed)
70cc Propofol infusion bottle wasted in biohazard bin with 2nd RN, Judeth Porch, as witness.
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
## 2019-04-21 NOTE — Procedures (Signed)
Extubation Procedure Note  Patient Details:   Name: Koven Belinsky DOB: [birth year] MRN: [id number]   Airway Documentation:    Vent end date: 04/21/19 Vent end time: 0836   Evaluation  O2 sats: stable throughout Complications: No apparent complications Patient did tolerate procedure well. Bilateral Breath Sounds: Clear, Diminished   Yes   Pt extubated to 2L N/C.  No stridor noted.  RN @ bedside.  Donnetta Hail 04/21/2019, 8:38 AM

## 2019-04-21 NOTE — Progress Notes (Signed)
I have reviewed his CT and clav xray.   WIll plan for non-operative management of his clavicle for now.    Sling when sitting up or out of bed. OK to be out of sling when supine.    F/u with Me in Excel Cell [phone]

## 2019-04-21 NOTE — Progress Notes (Signed)
Orthopedic Tech Progress Note Patient Details:  Louis Hodge 03-Oct-1975 [id number]  Patient ID: Louis Hodge, male   DOB: 1976-07-31, 43 y.o.   MRN: [id number]   Louis Hodge 04/21/2019, 11:26 AMCalled [employer] for CTO brace.

## 2019-04-21 NOTE — Progress Notes (Signed)
Pt becoming slightly more agitated. Per sister pt is an everyday drinker, although he doesn't drink "a ton". Pt is also a pack a day smoker. Paged trauma MD and orders received for CIWA protocol and Nicotine patch. Pt also with large emesis, coffee ground. Verbal order to keep pt NPO and ensure a PPI is order, which was verfied q24hr. Will continue to monitor.

## 2019-04-22 ENCOUNTER — Encounter (HOSPITAL_COMMUNITY): Payer: Self-pay | Admitting: *Deleted

## 2019-04-22 ENCOUNTER — Inpatient Hospital Stay (HOSPITAL_COMMUNITY): Payer: PRIVATE HEALTH INSURANCE

## 2019-04-22 LAB — CBC
HCT: 35.5 % — ABNORMAL LOW (ref 39.0–52.0)
Hemoglobin: 12 g/dL — ABNORMAL LOW (ref 13.0–17.0)
MCH: 30.5 pg (ref 26.0–34.0)
MCHC: 33.8 g/dL (ref 30.0–36.0)
MCV: 90.1 fL (ref 80.0–100.0)
Platelets: 218 10*3/uL (ref 150–400)
RBC: 3.94 MIL/uL — ABNORMAL LOW (ref 4.22–5.81)
RDW: 15 % (ref 11.5–15.5)
WBC: 14.4 10*3/uL — ABNORMAL HIGH (ref 4.0–10.5)
nRBC: 0 % (ref 0.0–0.2)

## 2019-04-22 LAB — BASIC METABOLIC PANEL
Anion gap: 7 (ref 5–15)
BUN: 5 mg/dL — ABNORMAL LOW (ref 6–20)
CO2: 26 mmol/L (ref 22–32)
Calcium: 8.2 mg/dL — ABNORMAL LOW (ref 8.9–10.3)
Chloride: 106 mmol/L (ref 98–111)
Creatinine, Ser: 0.85 mg/dL (ref 0.61–1.24)
GFR calc Af Amer: >60 mL/min (ref >60)
GFR calc non Af Amer: >60 mL/min (ref >60)
Glucose, Bld: 97 mg/dL (ref 70–99)
Potassium: 3.7 mmol/L (ref 3.5–5.1)
Sodium: 139 mmol/L (ref 135–145)

## 2019-04-22 LAB — TRIGLYCERIDES: Triglycerides: 100 mg/dL (ref <150)

## 2019-04-22 MED ORDER — PANTOPRAZOLE SODIUM 40 MG IV SOLR
40.0000 mg | Freq: Every day | INTRAVENOUS | Status: DC
  Administered 2019-04-22: 22:00:00 40 mg via INTRAVENOUS
  Filled 2019-04-22: qty 40

## 2019-04-22 MED ORDER — CLEVIDIPINE BUTYRATE 0.5 MG/ML IV EMUL
1.0000 mg/h | INTRAVENOUS | Status: DC
  Filled 2019-04-22: qty 50

## 2019-04-22 MED ORDER — HYDROCODONE-ACETAMINOPHEN 5-325 MG PO TABS: 1.0000 | ORAL_TABLET | ORAL | Status: DC | PRN

## 2019-04-22 MED ORDER — ACETAMINOPHEN 160 MG/5ML PO SOLN
325.0000 mg | Freq: Four times a day (QID) | ORAL | Status: DC
  Administered 2019-04-22 – 2019-04-23 (×5): 325 mg via ORAL
  Filled 2019-04-22 (×5): qty 20.3

## 2019-04-22 MED ORDER — MORPHINE SULFATE (PF) 2 MG/ML IV SOLN: 1.0000 mg | INTRAVENOUS | Status: DC | PRN

## 2019-04-22 MED ORDER — ENOXAPARIN SODIUM 40 MG/0.4ML ~~LOC~~ SOLN
40.0000 mg | SUBCUTANEOUS | Status: DC
  Administered 2019-04-22 – 2019-04-23 (×2): 40 mg via SUBCUTANEOUS
  Filled 2019-04-22 (×2): qty 0.4

## 2019-04-22 MED ORDER — LIDOCAINE HCL 1 % IJ SOLN
5.0000 mL | Freq: Once | INTRAMUSCULAR | Status: DC
  Filled 2019-04-22: qty 5

## 2019-04-22 MED ORDER — LORAZEPAM 2 MG/ML IJ SOLN: 2.0000 mg | INTRAMUSCULAR | Status: DC | PRN

## 2019-04-22 MED ORDER — LIDOCAINE HCL (PF) 1 % IJ SOLN
5.0000 mL | Freq: Once | INTRAMUSCULAR | Status: DC
  Filled 2019-04-22: qty 5

## 2019-04-22 MED ORDER — ONDANSETRON HCL 4 MG/2ML IJ SOLN: 4.0000 mg | INTRAMUSCULAR | Status: DC | PRN

## 2019-04-22 MED ORDER — SODIUM CHLORIDE 0.9 % IV SOLN
INTRAVENOUS | Status: DC
  Administered 2019-04-22: 18:00:00 via INTRAVENOUS

## 2019-04-22 MED ORDER — ONDANSETRON HCL 4 MG PO TABS: 4.0000 mg | ORAL_TABLET | ORAL | Status: DC | PRN
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
## 2019-04-22 NOTE — Progress Notes (Signed)
Pt has Cleviprex that has to is scheduled to be hung at 1745. RN Environmental health practitioner and she also agreed that we could not hang this medication because we do not titrate drips on our unit. RN called [facility] and the receptionist stated that they could not page the MDs about medications and to call back tomorrow at [place]..
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
## 2019-04-22 NOTE — Progress Notes (Signed)
Orthopedic Tech Progress Note Patient Details:  Louis Hodge 1976-07-27 [id number]  Ortho Devices Type of Ortho Device: Sling immobilizer Ortho Device/Splint Location: left Ortho Device/Splint Interventions: Application   Post Interventions Patient Tolerated: Well Instructions Provided: Care of device   [place] Pink 04/22/2019, 12:46 PM

## 2019-04-22 NOTE — Procedures (Signed)
Procedure: Right ring finger closed reduction  Indication: Right ring finger PIP dislocation  Surgeon: Silvestre Gunner, PA-C  Assist: None  Anesthesia: 54ml 1% lidocaine  EBL: None  Complications: None  Findings: After risks/benefits explained patient desires to undergo procedure. Time out performed. The right ring finger was digitally blocked and the PIP joint relocated. Post-reduction x-rays pending.    Lisette Abu, PA-C Orthopedic Surgery [phone]
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
## 2019-04-23 LAB — TRIGLYCERIDES: Triglycerides: 91 mg/dL (ref <150)

## 2019-04-23 MED ORDER — ACETAMINOPHEN 325 MG PO TABS: 650.0000 mg | ORAL_TABLET | Freq: Four times a day (QID) | ORAL | 0 refills | Status: AC | PRN

## 2019-04-23 MED ORDER — ASPIRIN EC 325 MG PO TBEC: 325.0000 mg | DELAYED_RELEASE_TABLET | Freq: Every day | ORAL | 0 refills | Status: AC

## 2019-04-23 MED ORDER — NICOTINE 7 MG/24HR TD PT24: 7.0000 mg | MEDICATED_PATCH | Freq: Every day | TRANSDERMAL | 0 refills | Status: AC

## 2019-04-23 MED ORDER — OXYCODONE HCL 5 MG PO TABS: 5.0000 mg | ORAL_TABLET | Freq: Four times a day (QID) | ORAL | 0 refills | Status: AC | PRN

## 2019-04-23 MED ORDER — GABAPENTIN 300 MG PO CAPS: 300.0000 mg | ORAL_CAPSULE | Freq: Three times a day (TID) | ORAL | 0 refills | Status: AC

## 2019-04-23 MED FILL — oxyCODONE HCL 10 MG TABS: 10 | 4 days supply | Qty: 15 | Fill #0

## 2019-04-23 MED FILL — ACETAMINOPHEN 325 MG TABS: 325 | 7 days supply | Qty: 60 | Fill #0

## 2019-04-23 MED FILL — ASPIRIN EC 325 MG TABLET: 325 | 30 days supply | Qty: 30 | Fill #0

## 2019-04-23 MED FILL — GABAPENTIN 300 MG CAPSULE: 300 | 30 days supply | Qty: 90 | Fill #0
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
## 2019-04-23 NOTE — Progress Notes (Signed)
Orthopedic Tech Progress Note Patient Details:  Louis Hodge 1975/10/17 [id number] Called in order to [facility] for [place] Patient ID: Nahsir Venezia, male   DOB: November 27, 1975, 43 y.o.   MRN: [id number]   Janit Pagan 04/23/2019, 10:37 AM
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
## 2019-04-23 NOTE — Discharge Instructions (Signed)
How to Use a Hard Cervical Collar A hard cervical collar limits the movement of the top part of your spine (cervical spine). The collar holds your head and neck in a straight position. A cervical collar may be used to treat:  A fracture in the neck.  Damage to the ligaments. Ligaments are tissues that connect bones.  Injury to the spinal cord. There are several types of hard cervical collars. Follow the manufacturer's instructions for use. These are general guidelines. What are the risks? Wearing a cervical collar is safe. However, problems may occur, including:  Skin breakdown.  Sores that form due to rubbing or pressure on the skin (pressure ulcers).  Pain.  Difficulty breathing.  Worsening of your condition if the collar is not placed correctly.  Increased risk of inhaling food or liquid into your lungs (aspiration). Supplies needed:  Extra cervical collar and replacement pads.  Ice.  Plastic bag.  Towel.  A watertight covering to put over the collar during bathing, if needed. How to use a cervical collar  Wear the cervical collar as told by your health care provider. Do not remove it unless told to do so by your health care provider.  You may be directed to remove it only when you check your skin and change the pads. While the collar is off: ? Ask another person to assist you if needed. ? Keep your head and neck straight.  Do not bend your neck or turn your head. ? Check your skin daily for red areas. Ask for help or use a mirror to check areas you cannot see. ? After checking your skin, wear the extra cervical collar while cleaning and changing the pads of the other collar.  Change the pads daily or more often if they become wet or dirty. Keep a clean set of replacement pads.  Do not let hard plastic edges touch your skin. Cover them with a soft pad.  If your cervical collar is not waterproof: ? Do not let it get wet. ? Cover it with a watertight covering when  you take a bath or a shower. Follow these instructions at home:   Put ice on the injured area to manage pain, stiffness, and swelling. ? Do not remove your cervical collar unless told to do so by your health care provider. ? Put ice in a plastic bag. ? Place a towel between your skin and the bag or between your cervical collar and the bag. ? Leave the ice on for 20 minutes, 2-3 times a day for the first 2 days after your injury.  Do not drive any vehicle until your health care provider approves.  Keep all follow-up visits as told by your health care provider. This is important. Any delay in getting the care that you need can prevent proper healing of your injury. Contact a health care provider if you have:  Red areas of skin under your cervical collar.  Pain that is not controlled with your medicines. Get help right away if you have:  Numbness or weakness in your arms or legs.  Difficulty breathing. These symptoms may represent a serious problem that is an emergency. Do not wait to see if the symptoms will go away. Get medical help right away. Call your local emergency services (911 in the [place] Do not drive yourself to the hospital. Summary  A cervical collar is a device that supports the chin and the back of the head. It restricts movement of the neck to  prevent more damage after a severe injury.  A cervical collar may be used to treat a fracture in the neck, damage to tissues that hold bones together (ligaments), or injury to the spinal cord.  Wear the cervical collar as told by your health care provider. Ask if you may remove the collar to shower, bathe, or eat, or to put ice on your neck. This information is not intended to replace advice given to you by your health care provider. Make sure you discuss any questions you have with your health care provider. Document Released: 06/09/2004 Document Revised: 03/25/2018 Document Reviewed: 08/09/2017 Elsevier Patient Education  2020  [employer]   How to Use a Back Brace  A back brace is a form-fitting device that wraps around your trunk to support your lower back, abdomen, and hips. You may need to wear a back brace to relieve back pain or to correct a medical condition related to the back, such as abnormal curvature of the spine (scoliosis). A back brace can maintain or correct the shape of the spine and prevent a spinal problem from getting worse. A back brace can also take pressure off the layers of tissue (disks) between the bones of the spine (vertebrae). You may need a back brace to keep your back and spine in place while you heal from an injury or recover from surgery. Back braces can be either plastic (rigid brace) or soft elastic (dynamic brace).  A rigid brace usually covers both the front and back of the entire upper body.  A soft brace may cover only the lower back and abdomen and may fasten with self-adhesive elastic straps. Your health care provider will recommend the proper brace for your needs and medical condition. What are the risks?  A back brace may not help if you do not wear it as directed by your health care provider. Be sure to wear the brace exactly as instructed in order to prevent further back problems.  Wearing the brace may be uncomfortable at first. You may have trouble sleeping with it on. It may also be hard for you to do certain activities while wearing it.  If the back brace is not worn as instructed, it may cause rubbing and pressure on your skin and may cause sores. How to use a back brace Different types of braces will have different instructions for use. Follow instructions from your health care provider about:  How to put on the brace.  When and how often to wear the brace. In some cases, braces may need to be worn for long stretches of time. For example, a brace may need to be worn for 16-23 hours a day when used for scoliosis.  How to take off the brace.  Any safety tips you  should follow when wearing the brace. This may include: ? Move carefully while wearing the brace. The brace restricts your movement and could lead to additional injuries. ? Use a cane or walker for support if you feel unsteady. ? Sit in high, firm chairs. It may be difficult to stand up from low, soft chairs. ? Check the skin around the brace every day. Tell your health care provider about any concerns. How to care for a back brace  Do not let the back brace get wet. Typically, you will remove the brace for bathing and then put it back on afterward.  If you have a rigid brace, be sure to store it in a safe place when you are  not wearing it. This will help to prevent damage.  Clean or wash the back brace with mild soap and water as told by your health care provider. Contact a health care provider if:  Your brace gets damaged.  You have pain or discomfort when wearing the back brace.  Your back pain is getting worse or is not improving over time.  You notice redness or skin breakdown from wearing the brace. Summary  You may need to wear a back brace to relieve back pain or to correct a medical condition related to the back, such as abnormal curvature of the spine (scoliosis).  Follow instructions as told by your health care provider about how to use the brace and how to take care of it.  A back brace may not help if you do not wear it as directed by your health care provider. Wear the brace exactly as instructed in order to prevent further back problems.  Move carefully while wearing the brace.  Contact a health care provider if you have pain or discomfort when wearing the back brace or your back pain is getting worse or is not improving over time. This information is not intended to replace advice given to you by your health care provider. Make sure you discuss any questions you have with your health care provider. Document Released: 09/06/2011 Document Revised: 08/13/2018 Document  Reviewed: 08/13/2018 Elsevier Patient Education  2020 [place], Adult  A concussion is a brain injury from a hard, direct hit (trauma) to your head or body. This direct hit causes the brain to quickly shake back and forth inside the skull. A concussion may also be called a mild traumatic brain injury (TBI). Healing from this injury can take time. What are the causes? This condition is caused by:  A direct hit to your head, such as: ? Running into a player during a game. ? Being hit in a fight. ? Hitting your head on a hard surface.  A quick and sudden movement (jolt) of the head or neck, such as in a car crash. What are the signs or symptoms? The signs of a concussion can be hard to notice. They may be missed by you, family members, and doctors. You may look fine on the outside but may not act or feel normal. Physical symptoms  Headaches.  Being tired (fatigued).  Being dizzy.  Problems with body balance.  Problems seeing or hearing.  Being sensitive to light or noise.  Feeling sick to your stomach (nausea) or throwing up (vomiting).  Not sleeping or eating as you used to.  Loss of feeling (numbness) or tingling in the body.  Seizure. Mental and emotional symptoms  Problems remembering things.  Trouble focusing your mind (concentrating), organizing, or making decisions.  Being slow to think, act, react, speak, or read.  Feeling grouchy (irritable).  Having mood changes.  Feeling worried or nervous (anxious).  Feeling sad (depressed). How is this treated? This condition may be treated by:  Stopping sports or activity if you are injured. If you hit your head or have signs of concussion: ? Do not return to sports or activities the same day. ? Get checked by a doctor before you return to your activities.  Resting your body and your mind.  Being watched carefully, often at home.  Medicines to help with symptoms such as: ? Feeling sick to  your stomach. ? Headaches. ? Problems with sleep.  Avoid taking strong pain medicines (opioids) for  a concussion.  Avoiding alcohol and drugs.  Being asked to go to a concussion clinic or a place to help you recover (rehabilitation center). Recovery from a concussion can take time. Return to activities only:  When you are fully healed.  When your doctor says it is safe. Follow these instructions at home: Activity  Limit activities that need a lot of thought or focus, such as: ? Homework or work for your job. ? Watching TV. ? Using the computer or phone. ? Playing memory games and puzzles.  Rest. Rest helps your brain heal. Make sure you: ? Get plenty of sleep. Most adults should get 7-9 hours of sleep each night. ? Rest during the day. Take naps or breaks when you feel tired.  Avoid activity like exercise until your doctor says its safe. Stop any activity that makes symptoms worse.  Do not do activities that could cause a second concussion, such as riding a bike or playing sports.  Ask your doctor when you can return to your normal activities, such as school, work, sports, and driving. Your ability to react may be slower. Do not do these activities if you are dizzy. General instructions   Take over-the-counter and prescription medicines only as told by your doctor.  Do not drink alcohol until your doctor says you can.  Watch your symptoms and tell other people to do the same. Other problems can occur after a concussion. Older adults have a higher risk of serious problems.  Tell your work Production designer, theatre/television/filmmanager, teachers, Tax adviserschool nurse, school counselor, coach, or Event organiserathletic trainer about your injury and symptoms. Tell them about what you can or cannot do.  Keep all follow-up visits as told by your doctor. This is important. How is this prevented?  It is very important that you do not get another brain injury. In rare cases, another injury can cause brain damage that will not go away,  brain swelling, or death. The risk of this is greatest in the first 7-10 days after a head injury. To avoid injuries: ? Stop activities that could lead to a second concussion, such as contact sports, until your doctor says it is okay. ? When you return to sports or activities:  Do not crash into other players. This is how most concussions happen.  Follow the rules.  Respect other players. ? Get regular exercise. Do strength and balance training. ? Wear a helmet that fits you well during sports, biking, or other activities.  Helmets can help protect you from serious skull and brain injuries, but they do not protect you from a concussion. Even when wearing a helmet, you should avoid being hit in the head. Contact a doctor if:  Your symptoms get worse or they do not get better.  You have new symptoms.  You have another injury. Get help right away if:  You have bad headaches or your headaches get worse.  You feel weak or numb in any part of your body.  You are mixed up (confused).  Your balance gets worse.  You keep throwing up.  You feel more sleepy than normal.  Your speech is not clear (is slurred).  You cannot recognize people or places.  You have a seizure.  Others have trouble waking you up.  You have behavior changes.  You have changes in how you see (vision).  You pass out (lose consciousness). Summary  A concussion is a brain injury from a hard, direct hit (trauma) to your head or body.  This  condition is treated with rest and careful watching of symptoms.  If you keep having symptoms, call your doctor. This information is not intended to replace advice given to you by your health care provider. Make sure you discuss any questions you have with your health care provider. Document Released: 09/05/2009 Document Revised: 05/08/2018 Document Reviewed: 05/08/2018 Elsevier Patient Education  2020 [employer] Inc[employer]   Clavicle Fracture  A clavicle fracture is a  broken collarbone. The collarbone is the long bone that connects your shoulder to your chest wall. A broken collarbone may be treated with a sling or with surgery. Treatment depends on whether the broken ends of the bone are out of place or not. Follow these instructions at home: If you have a sling:  Wear the sling as told by your doctor. Take it off only as told by your doctor.  Loosen the sling if your fingers tingle, become numb, or turn cold and blue.  Do not lift your arm. Keep it across your chest.  Keep the sling clean.  Ask your doctor if you may take off the sling for bathing. ? If your sling is not waterproof, do not let it get wet. Cover the sling with a watertight covering if you take a bath or a shower while wearing it. ? If you may take off your sling when you take a bath or a shower, keep your shoulder in the same position as when the sling is on. Managing pain, stiffness, and swelling   If told, put ice on the injured area: ? If you have a removable sling, take it off as told by your doctor. ? Put ice in a plastic bag. ? Place a towel between your skin and the bag. ? Leave the ice on for 20 minutes, 2-3 times a day. Activity  Avoid activities that make your symptoms worse for 4-6 weeks, or as long as told.  Ask your doctor when it is safe for you to drive.  Do exercises as told by your doctor. General instructions  Do not use any products that contain nicotine or tobacco, such as cigarettes and e-cigarettes. These can delay bone healing. If you need help quitting, ask your doctor.  Take over-the-counter and prescription medicines only as told by your doctor.  Keep all follow-up visits as told by your doctor. This is important. Contact a doctor if:  Your medicine is not making you feel less pain.  Your medicine is not making swelling better. Get help right away if:  Your cannot feel your arm (your arm is numb).  Your arm is cold.  Your arm is a lighter  color than normal. Summary  A clavicle fracture is a broken collarbone. The collarbone is the long bone that connects your shoulder to your chest wall.  Treatment depends on whether the broken ends of the bone are out of place or not.  If you have a sling, wear it as told by your doctor.  Do exercises when your doctor says you can. The exercises will help your arm get strong and move like it used to. This information is not intended to replace advice given to you by your health care provider. Make sure you discuss any questions you have with your health care provider. Document Released: 03/05/2008 Document Revised: 08/30/2017 Document Reviewed: 08/06/2016 Elsevier Patient Education  2020 [employer]

## 2019-04-23 NOTE — Progress Notes (Signed)
Orthopedic Tech Progress Note Patient Details:  Louis Hodge [birth year] [id number] Called [facility] and patient is in the system to be serviced Patient ID: Louis Hodge, male   DOB: [birth year], 43 y.o.   MRN: [id number]   Louis Hodge 04/23/2019, 8:53 AM
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
## 2019-05-13 ENCOUNTER — Ambulatory Visit: Payer: PRIVATE HEALTH INSURANCE | Admitting: Physical Therapy

## 2019-05-13 ENCOUNTER — Ambulatory Visit: Payer: PRIVATE HEALTH INSURANCE | Attending: Physician Assistant | Admitting: Occupational Therapy

## 2019-05-13 ENCOUNTER — Other Ambulatory Visit: Payer: Self-pay

## 2019-05-13 ENCOUNTER — Telehealth: Payer: Self-pay | Admitting: Physical Therapy

## 2019-05-13 ENCOUNTER — Encounter: Payer: Self-pay | Admitting: Occupational Therapy

## 2019-05-13 DIAGNOSIS — M6281 Muscle weakness (generalized): Secondary | ICD-10-CM | POA: Diagnosis present

## 2019-05-13 DIAGNOSIS — M25541 Pain in joints of right hand: Secondary | ICD-10-CM | POA: Insufficient documentation

## 2019-05-13 DIAGNOSIS — M25612 Stiffness of left shoulder, not elsewhere classified: Secondary | ICD-10-CM | POA: Diagnosis present

## 2019-05-13 DIAGNOSIS — M25641 Stiffness of right hand, not elsewhere classified: Secondary | ICD-10-CM | POA: Insufficient documentation

## 2019-05-13 DIAGNOSIS — M25611 Stiffness of right shoulder, not elsewhere classified: Secondary | ICD-10-CM | POA: Insufficient documentation

## 2019-05-13 DIAGNOSIS — R41840 Attention and concentration deficit: Secondary | ICD-10-CM | POA: Insufficient documentation

## 2019-05-13 DIAGNOSIS — R41844 Frontal lobe and executive function deficit: Secondary | ICD-10-CM | POA: Diagnosis present

## 2019-05-13 NOTE — Telephone Encounter (Signed)
To: Consuella Lose, MD From: Elsie Ra, PT, DPT  I have patient Louis Hodge DOB 1976/02/18 for a PT evaluation (referred from [place], Floyce Stakes, PA-C). He will be followed by OT at this time for work with UE and cognition and fine motor tasks however I do not think I should begin PT until he gets clearance to come out of his brace. He appears to have good balance and mobility but will need neck ROM and strength when given clearance to come out of his brace. He will follow back up with you in 3 weeks. Can you send me any info on his precautions or if this plan is okay with you to wait 3 weeks on PT.   Thank you, Elsie Ra, PT,DPT [phone] phone [phone] fax
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
## 2019-05-27 ENCOUNTER — Ambulatory Visit: Payer: PRIVATE HEALTH INSURANCE | Admitting: Occupational Therapy

## 2019-05-27 ENCOUNTER — Other Ambulatory Visit: Payer: Self-pay

## 2019-05-27 ENCOUNTER — Encounter: Payer: Self-pay | Admitting: Occupational Therapy

## 2019-05-27 DIAGNOSIS — R41844 Frontal lobe and executive function deficit: Secondary | ICD-10-CM

## 2019-05-27 DIAGNOSIS — M25541 Pain in joints of right hand: Secondary | ICD-10-CM

## 2019-05-27 DIAGNOSIS — R41840 Attention and concentration deficit: Secondary | ICD-10-CM

## 2019-05-27 DIAGNOSIS — M6281 Muscle weakness (generalized): Secondary | ICD-10-CM

## 2019-05-27 DIAGNOSIS — M25612 Stiffness of left shoulder, not elsewhere classified: Secondary | ICD-10-CM

## 2019-05-27 DIAGNOSIS — M25641 Stiffness of right hand, not elsewhere classified: Secondary | ICD-10-CM

## 2019-05-27 DIAGNOSIS — M25611 Stiffness of right shoulder, not elsewhere classified: Secondary | ICD-10-CM

## 2019-05-27 NOTE — Patient Instructions (Addendum)
PIP Extension (Active Controlled With Wrist and MP Flexion)     Flexor Tendon Gliding (Active Hook Fist)   With fingers and knuckles straight, bend middle and tip joints. Do not bend large knuckles. Repeat 10 times. Do 3 sessions per day.   Flexor Tendon Gliding (Active Full Fist)   Straighten all fingers, then make a fist, bending all joints. Repeat 10 times. Do 3sessions per day.   MP / PIP / DIP Composite Flexion (Passive Stretch)    Use other hand to bend finger at all three joints. Hold 10 seconds. Repeat 10 times. Do 3 sessions per day.     Upper Extremity: [employer]  down.  Hold a ball/shoe box/pillow (shoulder width sized)  with arms straight and hands flat on either side. Start with ball on chest, then straighten elbows to push ball up to the ceiling trying to keep elbows next to body hold 3sec, then slowly lower by keeping elbows close to your side. Keep shoulders away from ears Repeat 10 times.  Rest, then do 10 more, 2x/day   Overhead Arm Extension - Supine     Lay down.  Hold a ball/shoe box/pillow (shoulder width sized) on tops of legs with arms straight and hands flat on either side. Slowly move arms up/back in an arc with elbows straight and then slowly lower back down.  Do not go over head. Keep shoulders away from ears. Repeat 10 times.    Rest, then do 10 more, 2x/day
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
## 2019-05-29 ENCOUNTER — Ambulatory Visit: Payer: PRIVATE HEALTH INSURANCE | Admitting: Occupational Therapy

## 2019-05-29 ENCOUNTER — Other Ambulatory Visit: Payer: Self-pay

## 2019-05-29 ENCOUNTER — Encounter: Payer: Self-pay | Admitting: Occupational Therapy

## 2019-05-29 DIAGNOSIS — M25612 Stiffness of left shoulder, not elsewhere classified: Secondary | ICD-10-CM

## 2019-05-29 DIAGNOSIS — R41840 Attention and concentration deficit: Secondary | ICD-10-CM

## 2019-05-29 DIAGNOSIS — M6281 Muscle weakness (generalized): Secondary | ICD-10-CM

## 2019-05-29 DIAGNOSIS — M25541 Pain in joints of right hand: Secondary | ICD-10-CM

## 2019-05-29 DIAGNOSIS — R41844 Frontal lobe and executive function deficit: Secondary | ICD-10-CM

## 2019-05-29 DIAGNOSIS — M25611 Stiffness of right shoulder, not elsewhere classified: Secondary | ICD-10-CM

## 2019-05-29 DIAGNOSIS — M25641 Stiffness of right hand, not elsewhere classified: Secondary | ICD-10-CM
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
## 2019-06-02 ENCOUNTER — Other Ambulatory Visit: Payer: Self-pay | Admitting: Physician Assistant

## 2019-06-02 DIAGNOSIS — S15101D Unspecified injury of right vertebral artery, subsequent encounter: Secondary | ICD-10-CM

## 2019-06-03 ENCOUNTER — Other Ambulatory Visit: Payer: Self-pay | Admitting: Physician Assistant

## 2019-06-03 ENCOUNTER — Other Ambulatory Visit: Payer: Self-pay

## 2019-06-03 ENCOUNTER — Encounter: Payer: Self-pay | Admitting: Occupational Therapy

## 2019-06-03 ENCOUNTER — Ambulatory Visit: Payer: PRIVATE HEALTH INSURANCE | Attending: Physician Assistant | Admitting: Occupational Therapy

## 2019-06-03 DIAGNOSIS — M25641 Stiffness of right hand, not elsewhere classified: Secondary | ICD-10-CM | POA: Diagnosis present

## 2019-06-03 DIAGNOSIS — M25612 Stiffness of left shoulder, not elsewhere classified: Secondary | ICD-10-CM | POA: Diagnosis present

## 2019-06-03 DIAGNOSIS — R41840 Attention and concentration deficit: Secondary | ICD-10-CM | POA: Diagnosis present

## 2019-06-03 DIAGNOSIS — R41844 Frontal lobe and executive function deficit: Secondary | ICD-10-CM | POA: Diagnosis present

## 2019-06-03 DIAGNOSIS — M25541 Pain in joints of right hand: Secondary | ICD-10-CM | POA: Diagnosis present

## 2019-06-03 DIAGNOSIS — M25611 Stiffness of right shoulder, not elsewhere classified: Secondary | ICD-10-CM | POA: Diagnosis present

## 2019-06-03 DIAGNOSIS — M6281 Muscle weakness (generalized): Secondary | ICD-10-CM | POA: Insufficient documentation
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
## 2019-06-04 ENCOUNTER — Ambulatory Visit: Payer: PRIVATE HEALTH INSURANCE | Admitting: Occupational Therapy

## 2019-06-04 DIAGNOSIS — M6281 Muscle weakness (generalized): Secondary | ICD-10-CM

## 2019-06-04 DIAGNOSIS — M25611 Stiffness of right shoulder, not elsewhere classified: Secondary | ICD-10-CM

## 2019-06-04 DIAGNOSIS — M25541 Pain in joints of right hand: Secondary | ICD-10-CM

## 2019-06-04 DIAGNOSIS — M25641 Stiffness of right hand, not elsewhere classified: Secondary | ICD-10-CM
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
## 2019-06-09 ENCOUNTER — Ambulatory Visit: Payer: PRIVATE HEALTH INSURANCE | Admitting: Occupational Therapy

## 2019-06-09 ENCOUNTER — Other Ambulatory Visit: Payer: Self-pay

## 2019-06-09 ENCOUNTER — Encounter: Payer: Self-pay | Admitting: Occupational Therapy

## 2019-06-09 ENCOUNTER — Ambulatory Visit
Admission: RE | Admit: 2019-06-09 | Discharge: 2019-06-09 | Disposition: A | Discharge status: Home / Self Care | Payer: PRIVATE HEALTH INSURANCE | Source: Ambulatory Visit | Attending: Physician Assistant | Admitting: Physician Assistant

## 2019-06-09 DIAGNOSIS — M25641 Stiffness of right hand, not elsewhere classified: Secondary | ICD-10-CM

## 2019-06-09 DIAGNOSIS — M25541 Pain in joints of right hand: Secondary | ICD-10-CM

## 2019-06-09 DIAGNOSIS — M25612 Stiffness of left shoulder, not elsewhere classified: Secondary | ICD-10-CM

## 2019-06-09 DIAGNOSIS — M25611 Stiffness of right shoulder, not elsewhere classified: Secondary | ICD-10-CM

## 2019-06-09 DIAGNOSIS — R41840 Attention and concentration deficit: Secondary | ICD-10-CM

## 2019-06-09 DIAGNOSIS — M6281 Muscle weakness (generalized): Secondary | ICD-10-CM

## 2019-06-09 DIAGNOSIS — S15101D Unspecified injury of right vertebral artery, subsequent encounter: Secondary | ICD-10-CM

## 2019-06-09 MED ORDER — IOPAMIDOL (ISOVUE-370) INJECTION 76%
75.0000 mL | Freq: Once | INTRAVENOUS | Status: AC | PRN
  Administered 2019-06-09: 75 mL via INTRAVENOUS

## 2019-06-09 NOTE — Patient Instructions (Signed)
       Dr. Kathyrn Sheriff,   We have been seeing Meridee Score for OT with focus on low to mid-range ROM/activity tolerance for shoulders/UE and R 4th digit ROM s/p dislocation.  Please clarify the following so that we can progress his therapy appropriately:   1.  Can he d/c the cervical collar at this time?  2.  Can physical therapy evaluate and treat neck ROM?  If not now, when?  (Physical therapy has not yet evaluated due to awaiting clearance for neck ROM)  3. Can he begin UE strengthening at this time?   4.  Does he have a specific lifting restriction?          If so, how much?  5.  Any other precautions for occupational or physical therapy?   Please feel free to contact me with questions or concerns.  Thank you,  Vianne Bulls, OTR/L [facility] [address] [place], [place]  [address] [phone] phone [phone] 06/09/19 2:06 PM
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
## 2019-06-10 ENCOUNTER — Other Ambulatory Visit: Payer: PRIVATE HEALTH INSURANCE

## 2019-06-11 ENCOUNTER — Ambulatory Visit: Payer: PRIVATE HEALTH INSURANCE | Admitting: Occupational Therapy

## 2019-06-11 ENCOUNTER — Other Ambulatory Visit: Payer: Self-pay

## 2019-06-11 DIAGNOSIS — M6281 Muscle weakness (generalized): Secondary | ICD-10-CM | POA: Diagnosis not present

## 2019-06-11 DIAGNOSIS — M25611 Stiffness of right shoulder, not elsewhere classified: Secondary | ICD-10-CM

## 2019-06-11 DIAGNOSIS — M25541 Pain in joints of right hand: Secondary | ICD-10-CM

## 2019-06-11 DIAGNOSIS — M25641 Stiffness of right hand, not elsewhere classified: Secondary | ICD-10-CM
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
## 2019-06-15 ENCOUNTER — Ambulatory Visit: Payer: PRIVATE HEALTH INSURANCE | Admitting: Rehabilitative and Restorative Service Providers"

## 2019-06-15 ENCOUNTER — Other Ambulatory Visit: Payer: Self-pay

## 2019-06-15 DIAGNOSIS — M6281 Muscle weakness (generalized): Secondary | ICD-10-CM

## 2019-06-15 NOTE — Therapy (Signed)
[place] [address] [place], [place], [address] Phone: [phone]   Fax:  [phone]  Patient Details  Name: Louis Hodge MRN: [id number] Date of Birth: 23-Jan-1976 Referring Provider:  Vivia Ewing, PA-C  Encounter Date: 06/15/2019   Subjective Assessment - 06/15/19 0928    Subjective  The patient arrives today for PT evaluation.  He has an order for PT, however neurosurgeon clarified orders (scanned in Media section) to hold PT evaluation for 3-4 more weeks.  He is scheduled next MD visit for 10/7, therefore PT recommends we wait until the week of 10/12 to do evaluation.    Currently in Pain?  Yes   patient reports stiffness     PT spent 20 minutes discussing current status with the patient and his sister.  They reported that he is able to remove neck brace at home per physician instructions, but must wear the neck brace out of the house.  He reports waking up during the night with  Neck stiffness and having to reposition himself.  He is maintaining a forward head, elevated scapular position and PT demonstrated with the patient in front of a mirror how to improve postural alignment as he is going without brace.  We scheduled visit for 4 weeks from now and scheduled out 1x/week x 4 weeks for appointments that work with sister's schedule for work.  [place] 06/15/2019, 12:01 PM  [place] [address] [place], [place], [address] Phone: [phone]   Fax:  [phone]

## 2019-06-16 ENCOUNTER — Ambulatory Visit: Payer: PRIVATE HEALTH INSURANCE | Admitting: Occupational Therapy

## 2019-06-16 DIAGNOSIS — M25641 Stiffness of right hand, not elsewhere classified: Secondary | ICD-10-CM

## 2019-06-16 DIAGNOSIS — R41840 Attention and concentration deficit: Secondary | ICD-10-CM

## 2019-06-16 DIAGNOSIS — M6281 Muscle weakness (generalized): Secondary | ICD-10-CM | POA: Diagnosis not present

## 2019-06-16 DIAGNOSIS — M25612 Stiffness of left shoulder, not elsewhere classified: Secondary | ICD-10-CM

## 2019-06-16 DIAGNOSIS — M25611 Stiffness of right shoulder, not elsewhere classified: Secondary | ICD-10-CM

## 2019-06-16 DIAGNOSIS — R41844 Frontal lobe and executive function deficit: Secondary | ICD-10-CM

## 2019-06-16 DIAGNOSIS — M25541 Pain in joints of right hand: Secondary | ICD-10-CM
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
## 2019-06-16 NOTE — Patient Instructions (Signed)
Strengthening: Resisted Flexion   Attach tube to door.  Hold tubing with one arm at side. Pull forward and up with elbow straight. Move shoulder through pain-free range of motion, no further than shoulder height. Repeat 15 times per set.  Do 1-2 sessions per day.  Don't let shoulder hike up     Strengthening: Resisted Extension   Attach one end to door.  Hold tubing in one hand, arm forward. Pull arm back, elbow straight.  Don't let shoulder hike up. Repeat 15 times per set. Do 1-2 sessions per day.     Resisted Horizontal Abduction: Bilateral   Sit or stand, tubing in both hands, palms down and arms out in front. Keeping arms straight, pinch shoulder blades together and stretch arms out.  HOLD AT BELLY BUTTON LEVEL. Repeat 15 times per set.  Do 1-2 sessions per day.   Elbow Flexion: Resisted   Hold tubing wrapped around  One hand and and other end secured under foot, curl arm up as far as possible keeping elbow down by your side. Repeat 15 times per set.  Do 1-2 sessions per day.     Triceps Extension (Frontal)    One arm forward at chest height and bent to 90, end of band in hand, other end secured on same side shoulder by other hand, extend arm slowly. Hold 2seconds. Repeat 15 times, alternating arms. Do 1-2 sessions per day.   **REPEAT ALL WITH BOTH ARMS    Lying down.  Hold 3lb weight in both hands (on ends of weight).  Start at chest and push up to straighten elbows.  10x.  Then start with arms on tops of legs, keeping elbows straight raise weight to shoulder height only.  10x.      Lying down.  Hold 1lb weight in one arm, thumb facing up.  Keeping elbow straight, raise arm to shoulder height.  10x.  Then make small circles at shoulder height (thumb up) 10x each direction.  Repeat with other arm.

## 2019-06-18 ENCOUNTER — Other Ambulatory Visit: Payer: Self-pay

## 2019-06-18 ENCOUNTER — Ambulatory Visit: Payer: PRIVATE HEALTH INSURANCE | Admitting: Occupational Therapy

## 2019-06-18 DIAGNOSIS — R41844 Frontal lobe and executive function deficit: Secondary | ICD-10-CM

## 2019-06-18 DIAGNOSIS — M6281 Muscle weakness (generalized): Secondary | ICD-10-CM | POA: Diagnosis not present

## 2019-06-18 DIAGNOSIS — M25612 Stiffness of left shoulder, not elsewhere classified: Secondary | ICD-10-CM

## 2019-06-18 DIAGNOSIS — R41840 Attention and concentration deficit: Secondary | ICD-10-CM

## 2019-06-18 DIAGNOSIS — M25611 Stiffness of right shoulder, not elsewhere classified: Secondary | ICD-10-CM
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
## 2019-06-22 ENCOUNTER — Other Ambulatory Visit: Payer: Self-pay

## 2019-06-22 ENCOUNTER — Ambulatory Visit: Payer: PRIVATE HEALTH INSURANCE | Admitting: Occupational Therapy

## 2019-06-22 DIAGNOSIS — M25611 Stiffness of right shoulder, not elsewhere classified: Secondary | ICD-10-CM

## 2019-06-22 DIAGNOSIS — R41844 Frontal lobe and executive function deficit: Secondary | ICD-10-CM

## 2019-06-22 DIAGNOSIS — M25612 Stiffness of left shoulder, not elsewhere classified: Secondary | ICD-10-CM

## 2019-06-22 DIAGNOSIS — R41840 Attention and concentration deficit: Secondary | ICD-10-CM

## 2019-06-22 DIAGNOSIS — M6281 Muscle weakness (generalized): Secondary | ICD-10-CM | POA: Diagnosis not present
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
## 2019-06-24 ENCOUNTER — Other Ambulatory Visit: Payer: Self-pay

## 2019-06-24 ENCOUNTER — Encounter: Payer: Self-pay | Admitting: Occupational Therapy

## 2019-06-24 ENCOUNTER — Ambulatory Visit: Payer: PRIVATE HEALTH INSURANCE | Admitting: Occupational Therapy

## 2019-06-24 DIAGNOSIS — M25641 Stiffness of right hand, not elsewhere classified: Secondary | ICD-10-CM

## 2019-06-24 DIAGNOSIS — M25611 Stiffness of right shoulder, not elsewhere classified: Secondary | ICD-10-CM

## 2019-06-24 DIAGNOSIS — R41840 Attention and concentration deficit: Secondary | ICD-10-CM

## 2019-06-24 DIAGNOSIS — R41844 Frontal lobe and executive function deficit: Secondary | ICD-10-CM

## 2019-06-24 DIAGNOSIS — M6281 Muscle weakness (generalized): Secondary | ICD-10-CM

## 2019-06-24 DIAGNOSIS — M25541 Pain in joints of right hand: Secondary | ICD-10-CM

## 2019-06-24 DIAGNOSIS — M25612 Stiffness of left shoulder, not elsewhere classified: Secondary | ICD-10-CM
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
## 2019-06-24 NOTE — Patient Instructions (Signed)
   1.  Wall push ups.  15x, 1-2x/day.  Hands shoulder width apart.  Keep elbows tucked in.  Shoulders down.  Head straight ahead    2.  Upper Extremity Extension (All-Fours)   Tighten stomach and raise left arm parallel to floor. Keep trunk rigid.  Thumb up.  Make sure shoulder stability.  Then switch arms Repeat 10 times per set.  Do 1-2 sessions per day.

## 2019-06-29 ENCOUNTER — Ambulatory Visit: Payer: PRIVATE HEALTH INSURANCE | Admitting: Occupational Therapy

## 2019-06-29 ENCOUNTER — Other Ambulatory Visit: Payer: Self-pay

## 2019-06-29 DIAGNOSIS — M6281 Muscle weakness (generalized): Secondary | ICD-10-CM

## 2019-06-29 DIAGNOSIS — M25612 Stiffness of left shoulder, not elsewhere classified: Secondary | ICD-10-CM

## 2019-06-29 DIAGNOSIS — M25641 Stiffness of right hand, not elsewhere classified: Secondary | ICD-10-CM

## 2019-06-29 DIAGNOSIS — M25611 Stiffness of right shoulder, not elsewhere classified: Secondary | ICD-10-CM

## 2019-06-29 DIAGNOSIS — R41840 Attention and concentration deficit: Secondary | ICD-10-CM
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
## 2019-07-01 ENCOUNTER — Other Ambulatory Visit: Payer: Self-pay

## 2019-07-01 ENCOUNTER — Encounter: Payer: Self-pay | Admitting: Occupational Therapy

## 2019-07-01 ENCOUNTER — Ambulatory Visit: Payer: PRIVATE HEALTH INSURANCE | Admitting: Occupational Therapy

## 2019-07-01 DIAGNOSIS — M25612 Stiffness of left shoulder, not elsewhere classified: Secondary | ICD-10-CM

## 2019-07-01 DIAGNOSIS — M25641 Stiffness of right hand, not elsewhere classified: Secondary | ICD-10-CM

## 2019-07-01 DIAGNOSIS — M6281 Muscle weakness (generalized): Secondary | ICD-10-CM | POA: Diagnosis not present

## 2019-07-01 DIAGNOSIS — R41840 Attention and concentration deficit: Secondary | ICD-10-CM

## 2019-07-01 DIAGNOSIS — M25541 Pain in joints of right hand: Secondary | ICD-10-CM

## 2019-07-01 DIAGNOSIS — M25611 Stiffness of right shoulder, not elsewhere classified: Secondary | ICD-10-CM

## 2019-07-01 NOTE — Patient Instructions (Signed)
Dr. Kathyrn Sheriff,    Louis Hodge is progressing well with UE/core strength and ROM.    1.  Can he d/c the cervical collar at this time?  2.  Can physical therapy evaluate and treat neck ROM?     Please feel free to contact me with questions or concerns.  Thank you,  Vianne Bulls, OTR/L [facility] [address] [place], [place]  [address] [phone] phone [phone] 06/09/19 2:06 PM
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
## 2019-07-13 ENCOUNTER — Ambulatory Visit: Payer: PRIVATE HEALTH INSURANCE | Attending: Physician Assistant | Admitting: Occupational Therapy

## 2019-07-13 ENCOUNTER — Other Ambulatory Visit: Payer: Self-pay

## 2019-07-13 ENCOUNTER — Encounter: Payer: PRIVATE HEALTH INSURANCE | Admitting: Occupational Therapy

## 2019-07-13 ENCOUNTER — Ambulatory Visit: Payer: PRIVATE HEALTH INSURANCE | Admitting: Rehabilitative and Restorative Service Providers"

## 2019-07-13 DIAGNOSIS — M6281 Muscle weakness (generalized): Secondary | ICD-10-CM | POA: Diagnosis not present

## 2019-07-13 DIAGNOSIS — M25611 Stiffness of right shoulder, not elsewhere classified: Secondary | ICD-10-CM | POA: Diagnosis present

## 2019-07-13 DIAGNOSIS — M25612 Stiffness of left shoulder, not elsewhere classified: Secondary | ICD-10-CM | POA: Diagnosis present

## 2019-07-13 DIAGNOSIS — M25641 Stiffness of right hand, not elsewhere classified: Secondary | ICD-10-CM | POA: Diagnosis present
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
## 2019-07-15 ENCOUNTER — Other Ambulatory Visit: Payer: Self-pay

## 2019-07-15 ENCOUNTER — Ambulatory Visit: Payer: PRIVATE HEALTH INSURANCE | Admitting: Occupational Therapy

## 2019-07-15 ENCOUNTER — Ambulatory Visit: Payer: PRIVATE HEALTH INSURANCE | Admitting: Rehabilitative and Restorative Service Providers"

## 2019-07-15 DIAGNOSIS — M6281 Muscle weakness (generalized): Secondary | ICD-10-CM | POA: Diagnosis not present

## 2019-07-15 DIAGNOSIS — M25611 Stiffness of right shoulder, not elsewhere classified: Secondary | ICD-10-CM

## 2019-07-15 DIAGNOSIS — M25612 Stiffness of left shoulder, not elsewhere classified: Secondary | ICD-10-CM
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
## 2019-07-20 ENCOUNTER — Ambulatory Visit: Payer: PRIVATE HEALTH INSURANCE | Admitting: Occupational Therapy

## 2019-07-20 ENCOUNTER — Other Ambulatory Visit: Payer: Self-pay

## 2019-07-20 ENCOUNTER — Ambulatory Visit: Payer: PRIVATE HEALTH INSURANCE

## 2019-07-20 DIAGNOSIS — M25611 Stiffness of right shoulder, not elsewhere classified: Secondary | ICD-10-CM

## 2019-07-20 DIAGNOSIS — M6281 Muscle weakness (generalized): Secondary | ICD-10-CM | POA: Diagnosis not present
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
## 2019-07-22 ENCOUNTER — Ambulatory Visit: Payer: PRIVATE HEALTH INSURANCE | Admitting: Occupational Therapy

## 2019-07-22 ENCOUNTER — Other Ambulatory Visit: Payer: Self-pay

## 2019-07-22 DIAGNOSIS — M6281 Muscle weakness (generalized): Secondary | ICD-10-CM

## 2019-07-22 DIAGNOSIS — M25611 Stiffness of right shoulder, not elsewhere classified: Secondary | ICD-10-CM

## 2019-07-22 DIAGNOSIS — M25641 Stiffness of right hand, not elsewhere classified: Secondary | ICD-10-CM

## 2019-07-22 NOTE — Patient Instructions (Signed)
   Extension - Prone (Dumbbell)    Lie with right arm hanging off side of bed. Lift hand back and up. Repeat __10__ times per set. Do __2__ sets per session.  Use __3__ lb weight.    Prone Plank (Eccentric)    On toes and elbows, pull abdomen in while stabilizing trunk. Slowly lower downward without arching back. _5__ reps per set, hold 5-10 sec. _2__ sets per day   Wall Push-Up    With feet and hands shoulder-width apart, lean into wall, then push away from wall. Repeat _10___ times. Do _2___ sessions per day.    Elbow Extension: Chair Stand - Resisted    With hands on armrests, push up from chair. Use legs as much as necessary. Return slowly . Repeat __10__ times per set. Do _2__ sessions per day.
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
## 2019-07-27 ENCOUNTER — Ambulatory Visit: Payer: PRIVATE HEALTH INSURANCE

## 2019-07-27 ENCOUNTER — Ambulatory Visit: Payer: PRIVATE HEALTH INSURANCE | Admitting: Occupational Therapy

## 2019-07-29 ENCOUNTER — Ambulatory Visit: Payer: PRIVATE HEALTH INSURANCE | Admitting: Occupational Therapy

## 2019-08-03 ENCOUNTER — Encounter: Payer: PRIVATE HEALTH INSURANCE | Admitting: Occupational Therapy

## 2019-08-03 ENCOUNTER — Ambulatory Visit: Payer: PRIVATE HEALTH INSURANCE

## 2019-08-05 ENCOUNTER — Encounter: Payer: PRIVATE HEALTH INSURANCE | Admitting: Occupational Therapy

## 2019-08-06 ENCOUNTER — Ambulatory Visit: Payer: PRIVATE HEALTH INSURANCE | Attending: Physician Assistant | Admitting: Occupational Therapy

## 2019-08-06 ENCOUNTER — Other Ambulatory Visit: Payer: Self-pay

## 2019-08-06 DIAGNOSIS — M25611 Stiffness of right shoulder, not elsewhere classified: Secondary | ICD-10-CM | POA: Insufficient documentation

## 2019-08-06 DIAGNOSIS — M6281 Muscle weakness (generalized): Secondary | ICD-10-CM | POA: Insufficient documentation
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
## 2019-08-10 ENCOUNTER — Encounter: Payer: PRIVATE HEALTH INSURANCE | Admitting: Occupational Therapy

## 2019-08-11 ENCOUNTER — Encounter: Payer: PRIVATE HEALTH INSURANCE | Admitting: Occupational Therapy

## 2019-08-12 ENCOUNTER — Encounter: Payer: PRIVATE HEALTH INSURANCE | Admitting: Occupational Therapy
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
## 2022-06-14 ENCOUNTER — Other Ambulatory Visit: Payer: Self-pay | Admitting: Student in an Organized Health Care Education/Training Program

## 2022-06-14 DIAGNOSIS — M546 Pain in thoracic spine: Secondary | ICD-10-CM

## 2022-06-26 ENCOUNTER — Ambulatory Visit
Admission: RE | Admit: 2022-06-26 | Discharge: 2022-06-26 | Disposition: A | Discharge status: Home / Self Care | Payer: Managed Care, Other (non HMO) | Source: Ambulatory Visit | Attending: Student in an Organized Health Care Education/Training Program | Admitting: Student in an Organized Health Care Education/Training Program

## 2022-06-26 DIAGNOSIS — M546 Pain in thoracic spine: Secondary | ICD-10-CM

## 2024-03-04 ENCOUNTER — Other Ambulatory Visit (HOSPITAL_BASED_OUTPATIENT_CLINIC_OR_DEPARTMENT_OTHER): Payer: Self-pay | Admitting: Family

## 2024-03-04 DIAGNOSIS — F1010 Alcohol abuse, uncomplicated: Secondary | ICD-10-CM

## 2024-03-04 DIAGNOSIS — F17200 Nicotine dependence, unspecified, uncomplicated: Secondary | ICD-10-CM

## 2024-03-04 DIAGNOSIS — J449 Chronic obstructive pulmonary disease, unspecified: Secondary | ICD-10-CM

## 2024-03-04 DIAGNOSIS — I2510 Atherosclerotic heart disease of native coronary artery without angina pectoris: Secondary | ICD-10-CM

## 2024-03-06 ENCOUNTER — Ambulatory Visit (HOSPITAL_BASED_OUTPATIENT_CLINIC_OR_DEPARTMENT_OTHER)
Admission: RE | Admit: 2024-03-06 | Discharge: 2024-03-06 | Disposition: A | Discharge status: Home / Self Care | Payer: Self-pay | Source: Ambulatory Visit | Attending: Family | Admitting: Family

## 2024-03-06 DIAGNOSIS — J449 Chronic obstructive pulmonary disease, unspecified: Secondary | ICD-10-CM

## 2024-03-06 DIAGNOSIS — I2510 Atherosclerotic heart disease of native coronary artery without angina pectoris: Secondary | ICD-10-CM

## 2024-03-06 DIAGNOSIS — F1010 Alcohol abuse, uncomplicated: Secondary | ICD-10-CM

## 2024-03-06 DIAGNOSIS — F17200 Nicotine dependence, unspecified, uncomplicated: Secondary | ICD-10-CM
